# Patient Record
Sex: Male | Born: 1960 | Race: White | Hispanic: No | Marital: Married | Smoking: Never smoker
Health system: Southern US, Community
[De-identification: ages and names within clinical notes are randomized; demographics above are authoritative.]

## PROBLEM LIST (undated history)

## (undated) DIAGNOSIS — I319 Disease of pericardium, unspecified: Secondary | ICD-10-CM

## (undated) DIAGNOSIS — N200 Calculus of kidney: Secondary | ICD-10-CM

## (undated) DIAGNOSIS — E785 Hyperlipidemia, unspecified: Secondary | ICD-10-CM

## (undated) DIAGNOSIS — I2510 Atherosclerotic heart disease of native coronary artery without angina pectoris: Secondary | ICD-10-CM

## (undated) DIAGNOSIS — T7840XA Allergy, unspecified, initial encounter: Secondary | ICD-10-CM

## (undated) DIAGNOSIS — I10 Essential (primary) hypertension: Secondary | ICD-10-CM

## (undated) DIAGNOSIS — E109 Type 1 diabetes mellitus without complications: Secondary | ICD-10-CM

## (undated) HISTORY — DX: Atherosclerotic heart disease of native coronary artery without angina pectoris: I25.10

## (undated) HISTORY — DX: Type 1 diabetes mellitus without complications: E10.9

## (undated) HISTORY — PX: CARDIAC CATHETERIZATION: SHX172

## (undated) HISTORY — DX: Hyperlipidemia, unspecified: E78.5

## (undated) HISTORY — PX: LITHOTRIPSY: SUR834

## (undated) HISTORY — DX: Essential (primary) hypertension: I10

## (undated) HISTORY — DX: Calculus of kidney: N20.0

## (undated) HISTORY — DX: Allergy, unspecified, initial encounter: T78.40XA

## (undated) HISTORY — DX: Disease of pericardium, unspecified: I31.9

---

## 2001-08-23 ENCOUNTER — Encounter: Admission: RE | Admit: 2001-08-23 | Discharge: 2001-11-21 | Payer: Self-pay

## 2003-12-12 ENCOUNTER — Ambulatory Visit (HOSPITAL_COMMUNITY): Admission: RE | Admit: 2003-12-12 | Discharge: 2003-12-12 | Payer: Self-pay | Admitting: Urology

## 2004-05-13 ENCOUNTER — Emergency Department (HOSPITAL_COMMUNITY): Admission: EM | Admit: 2004-05-13 | Discharge: 2004-05-13 | Payer: Self-pay | Admitting: Emergency Medicine

## 2004-07-17 ENCOUNTER — Ambulatory Visit (HOSPITAL_COMMUNITY): Admission: RE | Admit: 2004-07-17 | Discharge: 2004-07-17 | Payer: Self-pay | Admitting: Cardiology

## 2004-07-17 DIAGNOSIS — I2510 Atherosclerotic heart disease of native coronary artery without angina pectoris: Secondary | ICD-10-CM

## 2004-07-17 HISTORY — DX: Atherosclerotic heart disease of native coronary artery without angina pectoris: I25.10

## 2006-04-20 ENCOUNTER — Ambulatory Visit (HOSPITAL_COMMUNITY): Admission: AD | Admit: 2006-04-20 | Discharge: 2006-04-20 | Payer: Self-pay | Admitting: General Surgery

## 2008-09-19 ENCOUNTER — Encounter: Admission: RE | Admit: 2008-09-19 | Discharge: 2008-10-09 | Payer: Self-pay | Admitting: Family Medicine

## 2008-10-09 ENCOUNTER — Encounter: Admission: RE | Admit: 2008-10-09 | Discharge: 2008-12-02 | Payer: Self-pay | Admitting: Family Medicine

## 2010-06-24 ENCOUNTER — Other Ambulatory Visit: Payer: Self-pay | Admitting: Internal Medicine

## 2010-06-24 DIAGNOSIS — Z8349 Family history of other endocrine, nutritional and metabolic diseases: Secondary | ICD-10-CM

## 2010-06-24 DIAGNOSIS — M542 Cervicalgia: Secondary | ICD-10-CM

## 2010-06-24 DIAGNOSIS — E1065 Type 1 diabetes mellitus with hyperglycemia: Secondary | ICD-10-CM

## 2010-06-24 DIAGNOSIS — E1049 Type 1 diabetes mellitus with other diabetic neurological complication: Secondary | ICD-10-CM

## 2010-07-01 ENCOUNTER — Ambulatory Visit
Admission: RE | Admit: 2010-07-01 | Discharge: 2010-07-01 | Disposition: A | Discharge status: Home / Self Care | Payer: BC Managed Care – PPO | Source: Ambulatory Visit | Attending: Internal Medicine | Admitting: Internal Medicine

## 2010-07-01 DIAGNOSIS — E1049 Type 1 diabetes mellitus with other diabetic neurological complication: Secondary | ICD-10-CM

## 2010-07-01 DIAGNOSIS — M542 Cervicalgia: Secondary | ICD-10-CM

## 2010-07-01 DIAGNOSIS — Z8349 Family history of other endocrine, nutritional and metabolic diseases: Secondary | ICD-10-CM

## 2010-08-28 NOTE — Consult Note (Signed)
NAMEGREGG, [name]             ACCOUNT NO.:  [id number]   MEDICAL RECORD NO.:  [id number]          PATIENT TYPE:  OUT   LOCATION:  DAY                          FACILITY:  [facility]   PHYSICIAN:  Sharlet Salina T. [name], M.D.DATE OF BIRTH:  06/10/1960   DATE OF CONSULTATION:  04/20/2006  DATE OF DISCHARGE:                                 CONSULTATION   .   CHIEF COMPLAINT:  Right lower quadrant abdominal pain.   HISTORY OF PRESENT ILLNESS:  [name] [name] is evaluated at the request  of Dr. Catha Gosselin.  He is a 50 year old male with insulin-dependent  diabetes mellitus, who awoke at about 3 a.m. or 4 a.m. this morning, now  12 hours ago, with a constant, fairly severe, right lower quadrant pain  over McBurney's point.  He states the pain was worse with any motion.  It was persistent and fairly severe, and he, therefore, presented to Dr.  Fredirick Maudlin office this morning.  He denies any nausea, vomiting, fever, or  chills.  The pain was worse with motion.  There was no radiation.  He  has no history of any similar pain.  He has had kidney stones but this  is a completely different character.   The patient was evaluated in Dr. Fredirick Maudlin office and was found to have  significant right lower quadrant tenderness and was sent for a CT scan  of the abdomen and pelvis at [facility] with results as  detailed below.  Due to concern of appendicitis,  the patient was  transferred to [facility] for my evaluation.  Interestingly,  on arrival to the hospital, he states that over the past 2-3 hours the  pain had nearly resolved.  He has only minimal, mild discomfort which he  states is probably less than a tenth of what it was earlier today.  He  is hungry.   PAST MEDICAL HISTORY:  1. Insulin-dependent diabetes mellitus.  2. Hypertension.  3. Elevated cholesterol.  4. He has a history of mild coronary disease status post coronary      catheterization.  5. History of kidney  stones.   MEDICATIONS:  1. Lantus insulin 10 at night plus sliding scale regular insulin      t.i.d. based on carbohydrate intake.  2. Metformin 500 mg b.i.d.  3. Cozaar 50 mg daily.  4. Calcium citrate.  5. Simvastatin 80 mg daily.   ALLERGIES:  LEVAQUIN.   SOCIAL HISTORY:  No cigarette use, occasional alcohol.  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   FAMILY HISTORY:  Noncontributory.   REVIEW OF SYSTEMS:  GENERAL:  No fever, chills, malaise.  RESPIRATORY:  No shortness of breath, cough, wheezing.  CARDIAC:  No chest pain,  palpitations.  ABDOMEN/GI:  As above.  GU:  No urinary burning,  frequency, or hematuria.   PHYSICAL EXAM:  Temperature is 97.7.  Vital signs all within normal  limits.  Heart rate 94.  GENERAL:  He is a thin, white male in no acute distress.  SKIN:  Warm and dry.  No rash or infection.  HEENT:  No palpable  mass or thyromegaly.  Sclerae are nonicteric.  LUNGS:  Clear without wheezing or increased work of breathing.  LYMPH NODES:  No cervical, subclavicular or inguinal nodes palpable.  CARDIAC:  Regular rate and rhythm.  No murmurs.  No edema.  ABDOMEN:  Nondistended.  Normoactive bowel sounds.  There is very mild,  right lower quadrant tenderness to deep palpation but no guarding.  No  rebound tenderness.  No palpable masses.  No organomegaly.  EXTREMITIES:  No joint swelling or deformity.  NEUROLOGIC:  Alert, oriented.  Motor and sensory exam is grossly normal.   LABORATORY:  Urinalysis at Dr. Fredirick Maudlin, positive only for glucose.  Laboratory here at [facility] includes a CBC showing a white count of  7.2 thousand, hemoglobin is 13.9.  Electrolytes all within normal  limits.  Glucose is 272.  LFTs normal.   I discussed the CT scan with Dr. Delphia Grates at [facility]  who read it and reviewed the films.  This does visualize the appendix  well which does contain contrast.  The appendix essentially appears  normal with no periappendiceal  inflammation or stranding, although there  is a very focal area of mild thickening in the mid appendix just 1-2 mm  thicker than the remainder the appendix.  This was very questionable for  early appendicitis.   ASSESSMENT AND PLAN:  A right lower quadrant pain which, now, clinically  appears to be resolving.  White count is normal and CT shows minimal, if  any, significant changes around the appendix.  I suspect he does not  have acute appendicitis or may have had a mild episode that is  spontaneously resolving.  As he clinically is so much improved, we will  plan just observation for now.  The patient is discharged home.  He  understands to call me immediately for any recurrent pain, fever,  vomiting, or other concerns.      [name] [name], M.D.  Electronically Signed     BTH/MEDQ  D:  04/20/2006  T:  04/20/2006  Job:  [id number]   cc:   Caryn Bee L. Little, M.D.  Fax: [phone]

## 2010-08-28 NOTE — Cardiovascular Report (Signed)
[name], [name]             ACCOUNT NO.:  [id number]   MEDICAL RECORD NO.:  [id number]          PATIENT TYPE:  OIB   LOCATION:  2899                         FACILITY:  [facility]   PHYSICIAN:  Armanda Magic, M.D.     DATE OF BIRTH:  [birth year]   DATE OF PROCEDURE:  07/17/2004  DATE OF DISCHARGE:                              CARDIAC CATHETERIZATION   REFERRING PHYSICIAN:  Catha Gosselin, M.D.   PROCEDURES:  Left heart catheterization, coronary angiography, left  ventriculography.   OPERATOR:  Armanda Magic, M.D.   INDICATIONS:  Chest pain, abnormal Cardiolite.   COMPLICATIONS:  None.   IV ACCESS:  Via right femoral artery 6-French sheath.   INDICATION:  This is an extremely pleasant 50 year old white male with a  history of hypertension, non-insulin-dependent diabetes mellitus and  hyperlipidemia who presented with chest pain and had a slightly abnormal  stress Cardiolite study showing a questionable small area of ischemia in the  anterior basal area as well as a fixed defect in the inferior base, but  possibly worse on stress imaging, worrisome for underlying ischemia.  The  patient now presents for heart catheterization.   DESCRIPTION OF PROCEDURE:  The patient was brought to cardiac  catheterization laboratory in a fasting non-sedated state.  Informed consent  was obtained.  The patient was connected to continuous heart rate and pulse  oximetry monitoring and intermittent blood pressure monitoring.  The right  groin was prepped and draped in a sterile fashion.  1%Xylocaine was used for  local anesthesia.  Using a modified Seldinger technique, a 6-French sheath  was placed in the right femoral artery.  Under fluoroscopic guidance, a 6-  [place] JL4 catheter was placed in the left coronary artery, but could not  engage the ostium adequately and it was exchanged out over a guidewire for 6-  [place] JL-3.5 catheter, which successfully engaged the left coronary ostium.  Multiple  cine films were taken in 30-degree RAO and 40-degree LAO views.  This catheter was then exchanged out over a guidewire for a 6-French JR4  catheter, which was placed under fluoroscopic guidance in the right coronary  artery.  Multiple cine films were taken in 30-degree RAO and 40-degree LAO  views.  This catheter was exchanged out over a guidewire for 6-French angled  pigtail catheter, which was placed under fluoroscopic guidance into the left  ventricular cavity.  Left ventriculography was performed in a 30-degree RAO  view using total of 30 mL of contrast at 15 mL per second.  The catheter was  then pulled back across the aortic valve with no significant gradient noted.  At the end of the procedure, all catheters and sheaths were removed.  Manual  compression was performed until adequate hemostasis was obtained.  The  patient was transferred back to the room in stable condition.   RESULTS:  Left main coronary is widely patent and bifurcates into the left  anterior descending artery and left circumflex artery.  The left anterior  descending artery is widely patent throughout its course to the apex, except  at the takeoff of a first diagonal, which  shows a 40% to 50% narrowing.  The  first diagonal is extremely large and is widely patent throughout its course  and bifurcates distally into 2 daughter branches.   The left circumflex system is small, giving rise to 2 obtuse marginal  branches, both of which are widely patent.   The right coronary is widely patent throughout its course and distally  bifurcates into a posterior descending artery and posterior lateral artery.   Left ventricular systolic function is normal with EF of 65%, aortic pressure  of 129/75 mmHg, LV pressure 129/1 mmHg.   ASSESSMENT:  1.  One-vessel nonobstructive coronary disease.  2.  Normal left ventricular function .  3.  Diabetes mellitus.   PLAN:  Review of films with Dr. Logan Bores, probable medical management.   He does  have an LAD stenosis, but I do not think it is flow-limiting, or does not  appear to be a significant stenosis.  We will continue on aspirin,  aggressive treatment diabetes and hyperlipidemia, no metformin for 48 hours.  He will follow with my nurse practitioner in 2 weeks for a groin check.       TT/MEDQ  D:  07/17/2004  T:  07/17/2004  Job:  [id number]   cc:   Caryn Bee L. Little, M.D.  [address]  [place]  [place] [address]  Fax: [phone]

## 2010-12-08 ENCOUNTER — Institutional Professional Consult (permissible substitution): Payer: BC Managed Care – PPO | Admitting: Critical Care Medicine

## 2010-12-10 ENCOUNTER — Encounter: Payer: Self-pay | Admitting: Critical Care Medicine

## 2010-12-10 ENCOUNTER — Other Ambulatory Visit: Payer: Self-pay | Admitting: Critical Care Medicine

## 2010-12-10 ENCOUNTER — Ambulatory Visit (INDEPENDENT_AMBULATORY_CARE_PROVIDER_SITE_OTHER): Payer: BC Managed Care – PPO | Admitting: Critical Care Medicine

## 2010-12-10 ENCOUNTER — Ambulatory Visit (HOSPITAL_BASED_OUTPATIENT_CLINIC_OR_DEPARTMENT_OTHER)
Admission: RE | Admit: 2010-12-10 | Discharge: 2010-12-10 | Disposition: A | Discharge status: Home / Self Care | Payer: BC Managed Care – PPO | Source: Ambulatory Visit | Attending: Critical Care Medicine | Admitting: Critical Care Medicine

## 2010-12-10 DIAGNOSIS — J4540 Moderate persistent asthma, uncomplicated: Secondary | ICD-10-CM | POA: Insufficient documentation

## 2010-12-10 DIAGNOSIS — R059 Cough, unspecified: Secondary | ICD-10-CM | POA: Insufficient documentation

## 2010-12-10 DIAGNOSIS — N200 Calculus of kidney: Secondary | ICD-10-CM

## 2010-12-10 DIAGNOSIS — R05 Cough: Secondary | ICD-10-CM

## 2010-12-10 DIAGNOSIS — E785 Hyperlipidemia, unspecified: Secondary | ICD-10-CM

## 2010-12-10 DIAGNOSIS — J45909 Unspecified asthma, uncomplicated: Secondary | ICD-10-CM

## 2010-12-10 DIAGNOSIS — I10 Essential (primary) hypertension: Secondary | ICD-10-CM | POA: Insufficient documentation

## 2010-12-10 DIAGNOSIS — D649 Anemia, unspecified: Secondary | ICD-10-CM

## 2010-12-10 DIAGNOSIS — E119 Type 2 diabetes mellitus without complications: Secondary | ICD-10-CM | POA: Insufficient documentation

## 2010-12-10 DIAGNOSIS — E109 Type 1 diabetes mellitus without complications: Secondary | ICD-10-CM | POA: Insufficient documentation

## 2010-12-10 MED ORDER — MOMETASONE FUROATE 220 MCG/INH IN AEPB: 2.0000 | INHALATION_SPRAY | Freq: Every day | RESPIRATORY_TRACT | Status: DC

## 2010-12-10 MED ORDER — BECLOMETHASONE DIPROPIONATE 80 MCG/ACT NA AERS: 2.0000 | INHALATION_SPRAY | Freq: Every day | NASAL | Status: DC

## 2010-12-10 NOTE — Progress Notes (Signed)
Subjective:    Patient ID: [name] [name], male    DOB: April 05, 1961, 50 y.o.   MRN: [id number]  HPI Comments: Full cardiac eval per [facility] neg  Cough This is a chronic problem. The current episode started more than 1 month ago. The problem has been waxing and waning. The problem occurs hourly. The cough is non-productive. Associated symptoms include nasal congestion, postnasal drip, shortness of breath and wheezing. Pertinent negatives include no chest pain, chills, ear pain, fever, headaches, heartburn, hemoptysis, myalgias, rash, rhinorrhea or sore throat. The symptoms are aggravated by pollens, fumes, dust, exercise and lying down (heat was a problem). He has tried a beta-agonist inhaler for the symptoms. The treatment provided no relief. His past medical history is significant for asthma, bronchitis and pneumonia. There is no history of COPD, emphysema or environmental allergies. PNA > 60yrs ago  Shortness of Breath This is a chronic problem. The current episode started more than 1 month ago. Episode frequency: was dyspneic at rest and exertion. Associated symptoms include wheezing. Pertinent negatives include no abdominal pain, chest pain, ear pain, fever, headaches, hemoptysis, leg swelling, neck pain, orthopnea, PND, rash, rhinorrhea, sore throat, sputum production or vomiting. The symptoms are aggravated by weather changes, URIs, odors, pollens, fumes, lying flat and exercise. Associated symptoms comments: Throat has to be cleared,  Notes hoarsness. He has tried beta agonist inhalers for the symptoms. The treatment provided no relief. His past medical history is significant for asthma and pneumonia. There is no history of allergies, aspirin allergies, bronchiolitis, CAD, chronic lung disease, COPD, DVT, a heart failure, PE or a recent surgery. (PNA > 71yrs ago)   50 y.o. WM Hx of bronchitis /wheeze. [name] comes on after URI, this time started May 2012, worse at night flat.  If breathe in a noise  was made. Now still dypsneic and coughing.  Now if breathe in deeply will cough  Past Medical History  Diagnosis Date  . Type I diabetes mellitus   . Asthma   . Dyslipidemia   . Hypertension   . Anemia   . Nephrolithiasis      Family History  Problem Relation Age of Onset  . Coronary artery disease Father   . Diabetes Father   . Hypertension Father   . Lung cancer Mother   . Diabetes Paternal Grandmother   . Diabetes Brother   . Hypothyroidism Sister   . Cancer Maternal Aunt   . Cancer Maternal Uncle      History   Social History  . Marital Status: Single    Spouse Name: N/A    Number of Children: N/A  . Years of Education: N/A   Occupational History  . Teacher    Social History Main Topics  . Smoking status: Never Smoker   . Smokeless tobacco: Never Used  . Alcohol Use: Yes     2 x weekly  . Drug Use: No  . Sexually Active: Not on file   Other Topics Concern  . Not on file   Social History Narrative  . No narrative on file     Allergies  Allergen Reactions  . Levaquin     stomach upset     Outpatient Prescriptions Prior to Visit  Medication Sig Dispense Refill  . albuterol (PROVENTIL HFA;VENTOLIN HFA) 108 (90 BASE) MCG/ACT inhaler Inhale 2 puffs into the lungs every 4 (four) hours as needed.        Marland Kitchen amLODipine (NORVASC) 5 MG tablet Take 5 mg by mouth daily.        Marland Kitchen  aspirin 325 MG tablet Take 325 mg by mouth daily.        Marland Kitchen glucagon (GLUCAGON EMERGENCY) 1 MG injection Inject 1 mg into the vein as directed.        . insulin aspart (NOVOLOG) 100 UNIT/ML injection Inject 45 Units into the skin daily. Via insulin pump       . rosuvastatin (CRESTOR) 20 MG tablet Take 20 mg by mouth daily.          w  Review of Systems  Constitutional: Positive for fatigue. Negative for fever, chills, diaphoresis, activity change, appetite change and unexpected weight change.  HENT: Positive for congestion, sneezing, voice change and postnasal drip. Negative for  hearing loss, ear pain, nosebleeds, sore throat, facial swelling, rhinorrhea, mouth sores, trouble swallowing, neck pain, neck stiffness, dental problem, sinus pressure, tinnitus and ear discharge.   Eyes: Negative for photophobia, discharge, itching and visual disturbance.  Respiratory: Positive for cough, shortness of breath and wheezing. Negative for apnea, hemoptysis, sputum production, choking, chest tightness and stridor.   Cardiovascular: Positive for palpitations. Negative for chest pain, orthopnea, leg swelling and PND.  Gastrointestinal: Negative for heartburn, nausea, vomiting, abdominal pain, constipation, blood in stool and abdominal distention.  Genitourinary: Negative for dysuria, urgency, frequency, hematuria, flank pain, decreased urine volume and difficulty urinating.  Musculoskeletal: Negative for myalgias, back pain, joint swelling, arthralgias and gait problem.  Skin: Negative for color change, pallor and rash.  Neurological: Negative for dizziness, tremors, seizures, syncope, speech difficulty, weakness, light-headedness, numbness and headaches.  Hematological: Negative for environmental allergies and adenopathy. Does not bruise/bleed easily.  Psychiatric/Behavioral: Negative for confusion, sleep disturbance and agitation. The patient is not nervous/anxious.        Objective:   Physical Exam  Filed Vitals:   12/10/10 0855  BP: 136/76  Pulse: 83  Temp: 98.7 F (37.1 C)  TempSrc: Oral  Height: 5\' 10"  (1.778 m)  Weight: 148 lb 8 oz (67.359 kg)  SpO2: 97%    Gen: Pleasant, well-nourished, in no distress,  normal affect  ENT: No lesions,  mouth clear,  oropharynx clear, no postnasal drip  Neck: No JVD, no TMG, no carotid bruits  Lungs: No use of accessory muscles, no dullness to percussion, clear without rales or rhonchi  Cardiovascular: RRR, heart sounds normal, no murmur or gallops, no peripheral edema  Abdomen: soft and NT, no HSM,  BS  normal  Musculoskeletal: No deformities, no cyanosis or clubbing  Neuro: alert, non focal  Skin: Warm, no lesions or rashes   8/30 CXR IMPRESSION:  Low-lying diaphragm may reflect body habitus but suggests element  of hyperinflation. No pulmonary infiltrates are present. No  pleural disease is evident.      Assessment & Plan:   Asthma with allergic rhinitis Moderate persistent asthma   Associated cyclical cough , moderate peripheral airflow obstruction on spirometry   Plan Start asmanex two puff daily Start QNasl two puff each nostril daily As needed albuterol     Updated Medication List Outpatient Encounter Prescriptions as of 12/10/2010  Medication Sig Dispense Refill  . albuterol (PROVENTIL HFA;VENTOLIN HFA) 108 (90 BASE) MCG/ACT inhaler Inhale 2 puffs into the lungs every 4 (four) hours as needed.        Marland Kitchen amLODipine (NORVASC) 5 MG tablet Take 5 mg by mouth daily.        Marland Kitchen aspirin 325 MG tablet Take 325 mg by mouth daily.        Marland Kitchen glucagon (GLUCAGON EMERGENCY) 1 MG  injection Inject 1 mg into the vein as directed.        . insulin aspart (NOVOLOG) 100 UNIT/ML injection Inject 45 Units into the skin daily. Via insulin pump       . rosuvastatin (CRESTOR) 20 MG tablet Take 20 mg by mouth daily.        . Beclomethasone Dipropionate (QNASL) 80 MCG/ACT AERS Place 2 puffs into the nose daily.  1 Inhaler  6  . mometasone (ASMANEX 120 METERED DOSES) 220 MCG/INH inhaler Inhale 2 puffs into the lungs daily.  1 Inhaler  6

## 2010-12-10 NOTE — Assessment & Plan Note (Addendum)
Moderate persistent asthma   Associated cyclical cough , moderate peripheral airflow obstruction on spirometry   Plan Start asmanex two puff daily Start QNasl two puff each nostril daily As needed albuterol

## 2010-12-10 NOTE — Patient Instructions (Signed)
Start asmanex two puff daily Start QNasl two puff each nostril daily As needed albuterol Labs and Xray today, will call results Return 6 week [place]

## 2010-12-11 LAB — ALLERGY FULL PROFILE
Allergen, D pternoyssinus,d7: <0.1 kU/L (ref <0.35)
Allergen,Goose feathers, e70: <0.1 kU/L (ref <0.35)
Alternaria Alternata: <0.1 kU/L (ref <0.35)
Aspergillus fumigatus, IgG: <0.1 kU/L (ref <0.35)
Bahia Grass: 0.26 kU/L (ref <0.35)
Bermuda Grass: <0.1 kU/L (ref <0.35)
Box Elder IgE: 0.37 kU/L — ABNORMAL HIGH (ref <0.35)
Candida Albicans: <0.1 kU/L (ref <0.35)
Cat Dander: <0.1 kU/L (ref <0.35)
Common Ragweed: 0.15 kU/L (ref <0.35)
Curvularia lunata: <0.1 kU/L (ref <0.35)
D. farinae: <0.1 kU/L (ref <0.35)
Dog Dander: <0.1 kU/L (ref <0.35)
Elm IgE: 0.14 kU/L (ref <0.35)
Fescue: <0.1 kU/L (ref <0.35)
G005 Rye, Perennial: <0.1 kU/L (ref <0.35)
G009 Red Top: <0.1 kU/L (ref <0.35)
Goldenrod: <0.1 kU/L (ref <0.35)
Helminthosporium halodes: <0.1 kU/L (ref <0.35)
House Dust Hollister: <0.1 kU/L (ref <0.35)
IgE (Immunoglobulin E), Serum: 25.4 IU/mL (ref 0.0–180.0)
Lamb's Quarters: 0.12 kU/L (ref <0.35)
Oak: 0.25 kU/L (ref <0.35)
Plantain: 0.11 kU/L (ref <0.35)
Stemphylium Botryosum: <0.1 kU/L (ref <0.35)
Sycamore Tree: 0.22 kU/L (ref <0.35)
Timothy Grass: <0.1 kU/L (ref <0.35)

## 2010-12-11 NOTE — Progress Notes (Signed)
Quick Note:  Called, spoke with pt. He is aware of cxr ok except findings of mild airway osbstruction d/t the asthma per PW. I informed him to stay on meds as prescribed at [facility] yesterday. He verbalized understanding of these results and recs. ______

## 2010-12-16 ENCOUNTER — Telehealth: Payer: Self-pay | Admitting: Critical Care Medicine

## 2010-12-16 NOTE — Telephone Encounter (Signed)
Initiated PA for Qnasl through [employer] at [phone]. Member ID # is [id number]. Qnasl is not covered. Covered alternatives are:  Nasonex, fluticasone, flunisolide and triamcinolone. Must must first try and fail 2 of the covered alternatives before Qnasl will be covered. LMOMTCB so we can notify the pt and find out if he has previously tried any of the covered alternatives before sending a new rx to his pharmacy.

## 2010-12-17 NOTE — Telephone Encounter (Signed)
Pt returned call & can be reached at [phone].  [name]

## 2010-12-18 MED ORDER — FLUTICASONE PROPIONATE 50 MCG/ACT NA SUSP: 2.0000 | Freq: Every day | NASAL | Status: DC

## 2010-12-18 NOTE — Telephone Encounter (Signed)
noted 

## 2010-12-18 NOTE — Telephone Encounter (Signed)
Pt says he has tried a sample of Nasonex in the past and it did not work well for him. He is willing to try fluticasone and will call to report if this does not work for him. [facility] [address] has been notified of this change and I will forward this msg to PW so he is aware. Medication has been updated to reflect the change.

## 2011-01-21 ENCOUNTER — Ambulatory Visit (INDEPENDENT_AMBULATORY_CARE_PROVIDER_SITE_OTHER): Payer: BC Managed Care – PPO | Admitting: Critical Care Medicine

## 2011-01-21 ENCOUNTER — Encounter: Payer: Self-pay | Admitting: Critical Care Medicine

## 2011-01-21 DIAGNOSIS — J45909 Unspecified asthma, uncomplicated: Secondary | ICD-10-CM

## 2011-01-21 NOTE — Assessment & Plan Note (Signed)
Markedly better on ICS with reactive airway disease as etiology of cyclical cough syndrome Plan Cont ICS and nasal steroid Return 4 months

## 2011-01-21 NOTE — Patient Instructions (Signed)
Continue Qnasl /fluticasone daily until second week December then as needed Continue ASmanex daily Return 4 months [place]

## 2011-01-21 NOTE — Progress Notes (Signed)
Subjective:    Patient ID: [name] [name], male    DOB: 24-Jan-1961, 50 y.o.   MRN: [id number]  HPI Comments: Full cardiac eval per Turner neg  50 y.o. WM Hx of bronchitis [name] [name]. [name] [name] comes on after URI, this time started May 2012, worse at night flat.  If breathe in a noise was made. Now still dypsneic and coughing.  Now if breathe in deeply will cough  01/21/2011 OVerall much improved with no coughing or dyspnea.   Pt denies any significant sore throat, nasal congestion or excess secretions, fever, chills, sweats, unintended weight loss, pleurtic or exertional chest pain, orthopnea PND, or leg swelling Pt denies any increase in rescue therapy over baseline, denies waking up needing it or having any early am or nocturnal exacerbations of coughing/wheezing/or dyspnea. Pt also denies any obvious fluctuation in symptoms with  weather or environmental change or other alleviating or aggravating factors   Past Medical History  Diagnosis Date  . Type I diabetes mellitus   . Asthma   . Dyslipidemia   . Hypertension   . Anemia   . Nephrolithiasis      Family History  Problem Relation Age of Onset  . Coronary artery disease Father   . Diabetes Father   . Hypertension Father   . Lung cancer Mother   . Diabetes Paternal Grandmother   . Diabetes Brother   . Hypothyroidism Sister   . Cancer Maternal Aunt   . Cancer Maternal Uncle      History   Social History  . Marital Status: Single    Spouse Name: N/A    Number of Children: N/A  . Years of Education: N/A   Occupational History  . Teacher    Social History Main Topics  . Smoking status: Never Smoker   . Smokeless tobacco: Never Used  . Alcohol Use: Yes     2 x weekly  . Drug Use: No  . Sexually Active: Not on file   Other Topics Concern  . Not on file   Social History Narrative  . No narrative on file     Allergies  Allergen Reactions  . Levaquin     stomach upset     Outpatient Prescriptions Prior to  Visit  Medication Sig Dispense Refill  . albuterol (PROVENTIL HFA;VENTOLIN HFA) 108 (90 BASE) MCG/ACT inhaler Inhale 2 puffs into the lungs every 4 (four) hours as needed.        Marland Kitchen amLODipine (NORVASC) 5 MG tablet Take 5 mg by mouth daily.        Marland Kitchen aspirin 325 MG tablet Take 325 mg by mouth daily.        . fluticasone (FLONASE) 50 MCG/ACT nasal spray Place 2 sprays into the nose daily.  16 g  2  . glucagon (GLUCAGON EMERGENCY) 1 MG injection Inject 1 mg into the vein as directed.        . insulin aspart (NOVOLOG) 100 UNIT/ML injection Inject 45 Units into the skin daily. Via insulin pump       . mometasone (ASMANEX 120 METERED DOSES) 220 MCG/INH inhaler Inhale 2 puffs into the lungs daily.  1 Inhaler  6  . rosuvastatin (CRESTOR) 20 MG tablet Take 20 mg by mouth daily.          w  Review of Systems  Constitutional: Negative for diaphoresis, activity change, appetite change, fatigue and unexpected weight change.  HENT: Negative for hearing loss, nosebleeds, congestion, facial swelling, sneezing, mouth sores, trouble  swallowing, neck stiffness, dental problem, voice change, sinus pressure, tinnitus and ear discharge.   Eyes: Negative for photophobia, discharge, itching and visual disturbance.  Respiratory: Negative for apnea, choking, chest tightness and stridor.   Cardiovascular: Negative for palpitations.  Gastrointestinal: Negative for nausea, constipation, blood in stool and abdominal distention.  Genitourinary: Negative for dysuria, urgency, frequency, hematuria, flank pain, decreased urine volume and difficulty urinating.  Musculoskeletal: Negative for back pain, joint swelling, arthralgias and gait problem.  Skin: Negative for color change and pallor.  Neurological: Negative for dizziness, tremors, seizures, syncope, speech difficulty, weakness, light-headedness and numbness.  Hematological: Negative for adenopathy. Does not bruise/bleed easily.  Psychiatric/Behavioral: Negative for  confusion, sleep disturbance and agitation. The patient is not nervous/anxious.        Objective:   Physical Exam   Filed Vitals:   01/21/11 0852  BP: 146/92  Pulse: 79  Temp: 98.1 F (36.7 C)  TempSrc: Oral  Height: 5\' 10"  (1.778 m)  Weight: 147 lb 8 oz (66.906 kg)  SpO2: 100%    Gen: Pleasant, well-nourished, in no distress,  normal affect  ENT: No lesions,  mouth clear,  oropharynx clear, no postnasal drip  Neck: No JVD, no TMG, no carotid bruits  Lungs: No use of accessory muscles, no dullness to percussion, clear without rales or rhonchi  Cardiovascular: RRR, heart sounds normal, no murmur or gallops, no peripheral edema  Abdomen: soft and NT, no HSM,  BS normal  Musculoskeletal: No deformities, no cyanosis or clubbing  Neuro: alert, non focal  Skin: Warm, no lesions or rashes        Assessment & Plan:   Asthma with allergic rhinitis Markedly better on ICS with reactive airway disease as etiology of cyclical cough syndrome Plan Cont ICS and nasal steroid Return 4 months      Updated Medication List Outpatient Encounter Prescriptions as of 01/21/2011  Medication Sig Dispense Refill  . albuterol (PROVENTIL HFA;VENTOLIN HFA) 108 (90 BASE) MCG/ACT inhaler Inhale 2 puffs into the lungs every 4 (four) hours as needed.        Marland Kitchen amLODipine (NORVASC) 5 MG tablet Take 5 mg by mouth daily.        Marland Kitchen aspirin 325 MG tablet Take 325 mg by mouth daily.        . fluticasone (FLONASE) 50 MCG/ACT nasal spray Place 2 sprays into the nose daily.  16 g  2  . glucagon (GLUCAGON EMERGENCY) 1 MG injection Inject 1 mg into the vein as directed.        . insulin aspart (NOVOLOG) 100 UNIT/ML injection Inject 45 Units into the skin daily. Via insulin pump       . mometasone (ASMANEX 120 METERED DOSES) 220 MCG/INH inhaler Inhale 2 puffs into the lungs daily.  1 Inhaler  6  . ONE TOUCH ULTRA TEST test strip as directed.      . rosuvastatin (CRESTOR) 20 MG tablet Take 20 mg by  mouth daily.

## 2011-04-13 HISTORY — PX: BACK SURGERY: SHX140

## 2011-05-25 ENCOUNTER — Encounter: Payer: Self-pay | Admitting: Critical Care Medicine

## 2011-05-25 ENCOUNTER — Ambulatory Visit (INDEPENDENT_AMBULATORY_CARE_PROVIDER_SITE_OTHER): Payer: BC Managed Care – PPO | Admitting: Critical Care Medicine

## 2011-05-25 ENCOUNTER — Ambulatory Visit (HOSPITAL_BASED_OUTPATIENT_CLINIC_OR_DEPARTMENT_OTHER)
Admission: RE | Admit: 2011-05-25 | Discharge: 2011-05-25 | Disposition: A | Discharge status: Home / Self Care | Payer: BC Managed Care – PPO | Source: Ambulatory Visit | Attending: Critical Care Medicine | Admitting: Critical Care Medicine

## 2011-05-25 VITALS — BP 132/86 | HR 95 | Temp 98.2°F | Ht 70.0 in | Wt 145.0 lb

## 2011-05-25 DIAGNOSIS — J189 Pneumonia, unspecified organism: Secondary | ICD-10-CM | POA: Insufficient documentation

## 2011-05-25 DIAGNOSIS — E119 Type 2 diabetes mellitus without complications: Secondary | ICD-10-CM | POA: Insufficient documentation

## 2011-05-25 DIAGNOSIS — R05 Cough: Secondary | ICD-10-CM

## 2011-05-25 DIAGNOSIS — R918 Other nonspecific abnormal finding of lung field: Secondary | ICD-10-CM

## 2011-05-25 DIAGNOSIS — R059 Cough, unspecified: Secondary | ICD-10-CM | POA: Insufficient documentation

## 2011-05-25 DIAGNOSIS — I10 Essential (primary) hypertension: Secondary | ICD-10-CM | POA: Insufficient documentation

## 2011-05-25 DIAGNOSIS — R062 Wheezing: Secondary | ICD-10-CM | POA: Insufficient documentation

## 2011-05-25 MED ORDER — AZITHROMYCIN 250 MG PO TABS: 250.0000 mg | ORAL_TABLET | Freq: Every day | ORAL | Status: AC

## 2011-05-25 NOTE — Progress Notes (Signed)
Subjective:    Patient ID: [name] [name], male    DOB: 19-Oct-1960, 51 y.o.   MRN: [id number]  HPI Comments: Full cardiac eval per [facility] neg  51 y.o. WM Hx of bronchitis [name] [name]. [name] [name] comes on after URI, this time started May 2012, worse at night flat.  If breathe in a noise was made. Now still dypsneic and coughing.  Now if breathe in deeply will cough  10/11 OVerall much improved with no coughing or dyspnea.   Pt denies any significant sore throat, nasal congestion or excess secretions, fever, chills, sweats, unintended weight loss, pleurtic or exertional chest pain, orthopnea PND, or leg swelling Pt denies any increase in rescue therapy over baseline, denies waking up needing it or having any early am or nocturnal exacerbations of coughing/wheezing/or dyspnea. Pt also denies any obvious fluctuation in symptoms with  weather or environmental change or other alleviating or aggravating factors  05/25/11 High fever, aches all over, started 05/14/11.  Saw PCP last 05/21/11. PNA on CXR. Rx augmentin/zpak/tessalon Now : is better ,  Still coughs .  Cough is dry.  Had flu vaccine .  Notes some wheeze.   Not that dyspneic.    No chest pain. Fever is gone. Ache and pain gone. Sinus are ok Pt denies any significant sore throat, nasal congestion or excess secretions, fever, chills, sweats, unintended weight loss, pleurtic or exertional chest pain, orthopnea PND, or leg swelling Pt denies any increase in rescue therapy over baseline, denies waking up needing it or having any early am or nocturnal exacerbations of coughing/wheezing/or dyspnea. Pt also denies any obvious fluctuation in symptoms with  weather or environmental change or other alleviating or aggravating factors    Past Medical History  Diagnosis Date  . Type I diabetes mellitus   . Asthma   . Dyslipidemia   . Hypertension   . Anemia   . Nephrolithiasis      Family History  Problem Relation Age of Onset  . Coronary artery disease  Father   . Diabetes Father   . Hypertension Father   . Lung cancer Mother   . Diabetes Paternal Grandmother   . Diabetes Brother   . Hypothyroidism Sister   . Cancer Maternal Aunt   . Cancer Maternal Uncle      History   Social History  . Marital Status: Married    Spouse Name: N/A    Number of Children: N/A  . Years of Education: N/A   Occupational History  . Teacher    Social History Main Topics  . Smoking status: Never Smoker   . Smokeless tobacco: Never Used  . Alcohol Use: Yes     2 x weekly  . Drug Use: No  . Sexually Active: Not on file   Other Topics Concern  . Not on file   Social History Narrative  . No narrative on file     Allergies  Allergen Reactions  . Levaquin     stomach upset     Outpatient Prescriptions Prior to Visit  Medication Sig Dispense Refill  . albuterol (PROVENTIL HFA;VENTOLIN HFA) 108 (90 BASE) MCG/ACT inhaler Inhale 2 puffs into the lungs every 4 (four) hours as needed.        Marland Kitchen amLODipine (NORVASC) 5 MG tablet Take 5 mg by mouth daily.        Marland Kitchen aspirin 325 MG tablet Take 325 mg by mouth daily.        Marland Kitchen glucagon (GLUCAGON EMERGENCY) 1 MG  injection Inject 1 mg into the vein as directed.        . insulin aspart (NOVOLOG) 100 UNIT/ML injection Inject 45 Units into the skin daily. Via insulin pump       . mometasone (ASMANEX 120 METERED DOSES) 220 MCG/INH inhaler Inhale 2 puffs into the lungs daily.  1 Inhaler  6  . ONE TOUCH ULTRA TEST test strip as directed.      . rosuvastatin (CRESTOR) 20 MG tablet Take 20 mg by mouth daily.        . fluticasone (FLONASE) 50 MCG/ACT nasal spray Place 2 sprays into the nose daily.  16 g  2      Review of Systems  Constitutional: Negative for diaphoresis, activity change, appetite change, fatigue and unexpected weight change.  HENT: Negative for hearing loss, nosebleeds, congestion, facial swelling, sneezing, mouth sores, trouble swallowing, neck stiffness, dental problem, voice change, sinus  pressure, tinnitus and ear discharge.   Eyes: Negative for photophobia, discharge, itching and visual disturbance.  Respiratory: Negative for apnea, choking, chest tightness and stridor.   Cardiovascular: Negative for palpitations.  Gastrointestinal: Negative for nausea, constipation, blood in stool and abdominal distention.  Genitourinary: Negative for dysuria, urgency, frequency, hematuria, flank pain, decreased urine volume and difficulty urinating.  Musculoskeletal: Negative for back pain, joint swelling, arthralgias and gait problem.  Skin: Negative for color change and pallor.  Neurological: Negative for dizziness, tremors, seizures, syncope, speech difficulty, weakness, light-headedness and numbness.  Hematological: Negative for adenopathy. Does not bruise/bleed easily.  Psychiatric/Behavioral: Negative for confusion, sleep disturbance and agitation. The patient is not nervous/anxious.        Objective:   Physical Exam   Filed Vitals:   05/25/11 0927  BP: 132/86  Pulse: 95  Temp: 98.2 F (36.8 C)  TempSrc: Oral  Height: 5\' 10"  (1.778 m)  Weight: 145 lb (65.772 kg)  SpO2: 97%    Gen: Pleasant, well-nourished, in no distress,  normal affect  ENT: No lesions,  mouth clear,  oropharynx clear, no postnasal drip  Neck: No JVD, no TMG, no carotid bruits  Lungs: No use of accessory muscles, no dullness to percussion, consolidation changes RLL  Cardiovascular: RRR, heart sounds normal, no murmur or gallops, no peripheral edema  Abdomen: soft and NT, no HSM,  BS normal  Musculoskeletal: No deformities, no cyanosis or clubbing  Neuro: alert, non focal  Skin: Warm, no lesions or rashes   Dg Chest 2 View  05/25/2011  *RADIOLOGY REPORT*  Clinical Data: Follow up pneumonia, still with coughing and wheezing, history hypertension, asthma, diabetes  CHEST - 2 VIEW  Comparison: 05/21/2011  Findings: Normal heart size, mediastinal contours, and pulmonary vascularity. Right lower  lobe infiltrate consistent with pneumonia, slightly improved. Lungs appear hyperexpanded but otherwise clear. No gross pleural effusion or pneumothorax. No acute osseous findings.  IMPRESSION: Improved right lower lobe infiltrate. Follow-up exams until resolution recommended to exclude underlying abnormalities/mass.  Original Report Authenticated By: Lollie Marrow, M.D.        Assessment & Plan:   Pneumonia Right lower lobes required pneumonia with associated asthma flare now improved no organism specified Plan extend azithromycin for additional 5 days Finish Augmentin Note  chest x-ray shows gradual clearance of right lower lobe infiltrate     Updated Medication List Outpatient Encounter Prescriptions as of 05/25/2011  Medication Sig Dispense Refill  . albuterol (PROVENTIL HFA;VENTOLIN HFA) 108 (90 BASE) MCG/ACT inhaler Inhale 2 puffs into the lungs every 4 (four) hours as needed.        Marland Kitchen  amLODipine (NORVASC) 5 MG tablet Take 5 mg by mouth daily.        Marland Kitchen amoxicillin-clavulanate (AUGMENTIN) 875-125 MG per tablet Take 1 tablet by mouth every 12 (twelve) hours.      Marland Kitchen aspirin 325 MG tablet Take 325 mg by mouth daily.        . benzonatate (TESSALON) 100 MG capsule Take 1 capsule by mouth every 8 (eight) hours as needed.      Marland Kitchen glucagon (GLUCAGON EMERGENCY) 1 MG injection Inject 1 mg into the vein as directed.        . insulin aspart (NOVOLOG) 100 UNIT/ML injection Inject 45 Units into the skin daily. Via insulin pump       . mometasone (ASMANEX 120 METERED DOSES) 220 MCG/INH inhaler Inhale 2 puffs into the lungs daily.  1 Inhaler  6  . ONE TOUCH ULTRA TEST test strip as directed.      . rosuvastatin (CRESTOR) 20 MG tablet Take 20 mg by mouth daily.        Marland Kitchen azithromycin (ZITHROMAX) 250 MG tablet Take 1 tablet (250 mg total) by mouth daily. Take two once then one daily until gone  6 each  0  . fluticasone (FLONASE) 50 MCG/ACT nasal spray Place 2 sprays into the nose as needed.      Marland Kitchen  DISCONTD: fluticasone (FLONASE) 50 MCG/ACT nasal spray Place 2 sprays into the nose daily.  16 g  2

## 2011-05-25 NOTE — Patient Instructions (Signed)
Chest xray today Take 5 days more of azithromycin 250mg   Take two once then one daily until gone Finish augmentin Stay on asmanex Return 2 weeks [place] [facility] for recheck

## 2011-05-26 NOTE — Assessment & Plan Note (Addendum)
Right lower lobes required pneumonia with associated asthma flare now improved no organism specified Plan extend azithromycin for additional 5 days Finish Augmentin Note  chest x-ray shows gradual clearance of right lower lobe infiltrate

## 2011-06-07 ENCOUNTER — Ambulatory Visit (INDEPENDENT_AMBULATORY_CARE_PROVIDER_SITE_OTHER): Payer: BC Managed Care – PPO | Admitting: Critical Care Medicine

## 2011-06-07 ENCOUNTER — Encounter: Payer: Self-pay | Admitting: Critical Care Medicine

## 2011-06-07 VITALS — BP 140/82 | HR 80 | Temp 98.0°F | Ht 70.0 in | Wt 144.5 lb

## 2011-06-07 DIAGNOSIS — J45909 Unspecified asthma, uncomplicated: Secondary | ICD-10-CM

## 2011-06-07 NOTE — Assessment & Plan Note (Addendum)
Right lower lobes required pneumonia with associated asthma flare now resolved Plan No further ABX Rx needed Cont maintenance asthma care

## 2011-06-07 NOTE — Patient Instructions (Addendum)
No change in medications. Follow a reflux diet Return in         4 months  Call sooner if cough or fever returns

## 2011-06-07 NOTE — Progress Notes (Signed)
Subjective:    Patient ID: [name] [name], male    DOB: 02/23/1961, 51 y.o.   MRN: [id number]  HPI 51 y.o. [facility]  05/25/11 High fever, aches all over, started 05/14/11.  Saw PCP last 05/21/11. PNA on CXR. Rx augmentin/zpak/tessalon Now : is better ,  Still coughs .  Cough is dry.  Had flu vaccine .  Notes some wheeze.   Not that dyspneic.    No chest pain. Fever is gone. Ache and pain gone. Sinus are ok Pt denies any significant sore throat, nasal congestion or excess secretions, fever, chills, sweats, unintended weight loss, pleurtic or exertional chest pain, orthopnea PND, or leg swelling Pt denies any increase in rescue therapy over baseline, denies waking up needing it or having any early am or nocturnal exacerbations of coughing/wheezing/or dyspnea. Pt also denies any obvious fluctuation in symptoms with  weather or environmental change or other alleviating or aggravating factors  06/07/2011 We extended ABX at last ov and pt was improving with improved RLL infiltrate No further cough, not dyspneic. No fever.  No chest pain.  CBGs are better   Past Medical History  Diagnosis Date  . Type I diabetes mellitus   . Asthma   . Dyslipidemia   . Hypertension   . Anemia   . Nephrolithiasis      Family History  Problem Relation Age of Onset  . Coronary artery disease Father   . Diabetes Father   . Hypertension Father   . Lung cancer Mother   . Diabetes Paternal Grandmother   . Diabetes Brother   . Hypothyroidism Sister   . Cancer Maternal Aunt   . Cancer Maternal Uncle      History   Social History  . Marital Status: Married    Spouse Name: N/A    Number of Children: N/A  . Years of Education: N/A   Occupational History  . Teacher    Social History Main Topics  . Smoking status: Never Smoker   . Smokeless tobacco: Never Used  . Alcohol Use: Yes     2 x weekly  . Drug Use: No  . Sexually Active: Not on file   Other Topics Concern  . Not on file   Social History  Narrative  . No narrative on file     Allergies  Allergen Reactions  . Levaquin     stomach upset     Outpatient Prescriptions Prior to Visit  Medication Sig Dispense Refill  . albuterol (PROVENTIL HFA;VENTOLIN HFA) 108 (90 BASE) MCG/ACT inhaler Inhale 2 puffs into the lungs every 4 (four) hours as needed.        Marland Kitchen amLODipine (NORVASC) 5 MG tablet Take 5 mg by mouth daily.        Marland Kitchen aspirin 325 MG tablet Take 325 mg by mouth daily.        . benzonatate (TESSALON) 100 MG capsule Take 1 capsule by mouth every 8 (eight) hours as needed.      Marland Kitchen glucagon (GLUCAGON EMERGENCY) 1 MG injection Inject 1 mg into the vein as directed.        . insulin aspart (NOVOLOG) 100 UNIT/ML injection Inject 45 Units into the skin daily. Via insulin pump       . mometasone (ASMANEX 120 METERED DOSES) 220 MCG/INH inhaler Inhale 2 puffs into the lungs daily.  1 Inhaler  6  . ONE TOUCH ULTRA TEST test strip as directed.      . rosuvastatin (CRESTOR) 20  MG tablet Take 20 mg by mouth daily.            Review of Systems  Constitutional: Negative for diaphoresis, activity change, appetite change, fatigue and unexpected weight change.  HENT: Negative for hearing loss, nosebleeds, congestion, facial swelling, sneezing, mouth sores, trouble swallowing, neck stiffness, dental problem, voice change, sinus pressure, tinnitus and ear discharge.   Eyes: Negative for photophobia, discharge, itching and visual disturbance.  Respiratory: Negative for apnea, choking, chest tightness and stridor.   Cardiovascular: Negative for palpitations.  Gastrointestinal: Negative for nausea, constipation, blood in stool and abdominal distention.  Genitourinary: Negative for dysuria, urgency, frequency, hematuria, flank pain, decreased urine volume and difficulty urinating.  Musculoskeletal: Negative for back pain, joint swelling, arthralgias and gait problem.  Skin: Negative for color change and pallor.  Neurological: Negative for  dizziness, tremors, seizures, syncope, speech difficulty, weakness, light-headedness and numbness.  Hematological: Negative for adenopathy. Does not bruise/bleed easily.  Psychiatric/Behavioral: Negative for confusion, sleep disturbance and agitation. The patient is not nervous/anxious.        Objective:   Physical Exam   Filed Vitals:   06/07/11 0857  BP: 140/82  Pulse: 80  Temp: 98 F (36.7 C)  TempSrc: Oral  Height: 5\' 10"  (1.778 m)  Weight: 144 lb 8 oz (65.545 kg)  SpO2: 98%    Gen: Pleasant, well-nourished, in no distress,  normal affect  ENT: No lesions,  mouth clear,  oropharynx clear, no postnasal drip  Neck: No JVD, no TMG, no carotid bruits  Lungs: No use of accessory muscles, no dullness to percussion, dry rales RLL, improved   Cardiovascular: RRR, heart sounds normal, no murmur or gallops, no peripheral edema  Abdomen: soft and NT, no HSM,  BS normal  Musculoskeletal: No deformities, no cyanosis or clubbing  Neuro: alert, non focal  Skin: Warm, no lesions or rashes         Assessment & Plan:   Asthma with allergic rhinitis Right lower lobes required pneumonia with associated asthma flare now resolved Plan No further ABX Rx needed Cont maintenance asthma care      Updated Medication List Outpatient Encounter Prescriptions as of 06/07/2011  Medication Sig Dispense Refill  . albuterol (PROVENTIL HFA;VENTOLIN HFA) 108 (90 BASE) MCG/ACT inhaler Inhale 2 puffs into the lungs every 4 (four) hours as needed.        Marland Kitchen amLODipine (NORVASC) 5 MG tablet Take 5 mg by mouth daily.        Marland Kitchen aspirin 325 MG tablet Take 325 mg by mouth daily.        . benzonatate (TESSALON) 100 MG capsule Take 1 capsule by mouth every 8 (eight) hours as needed.      Marland Kitchen glucagon (GLUCAGON EMERGENCY) 1 MG injection Inject 1 mg into the vein as directed.        . insulin aspart (NOVOLOG) 100 UNIT/ML injection Inject 45 Units into the skin daily. Via insulin pump       .  mometasone (ASMANEX 120 METERED DOSES) 220 MCG/INH inhaler Inhale 2 puffs into the lungs daily.  1 Inhaler  6  . ONE TOUCH ULTRA TEST test strip as directed.      . rosuvastatin (CRESTOR) 20 MG tablet Take 20 mg by mouth daily.

## 2011-09-14 ENCOUNTER — Other Ambulatory Visit: Payer: Self-pay | Admitting: Internal Medicine

## 2011-09-14 DIAGNOSIS — R221 Localized swelling, mass and lump, neck: Secondary | ICD-10-CM

## 2011-09-20 ENCOUNTER — Ambulatory Visit
Admission: RE | Admit: 2011-09-20 | Discharge: 2011-09-20 | Disposition: A | Discharge status: Home / Self Care | Payer: BC Managed Care – PPO | Source: Ambulatory Visit | Attending: Internal Medicine | Admitting: Internal Medicine

## 2011-09-20 DIAGNOSIS — R221 Localized swelling, mass and lump, neck: Secondary | ICD-10-CM

## 2011-12-09 ENCOUNTER — Ambulatory Visit (INDEPENDENT_AMBULATORY_CARE_PROVIDER_SITE_OTHER): Payer: BC Managed Care – PPO | Admitting: Critical Care Medicine

## 2011-12-09 ENCOUNTER — Encounter: Payer: Self-pay | Admitting: Critical Care Medicine

## 2011-12-09 VITALS — BP 144/80 | HR 82 | Temp 98.3°F | Ht 70.0 in | Wt 149.5 lb

## 2011-12-09 DIAGNOSIS — E109 Type 1 diabetes mellitus without complications: Secondary | ICD-10-CM

## 2011-12-09 DIAGNOSIS — J45909 Unspecified asthma, uncomplicated: Secondary | ICD-10-CM

## 2011-12-09 MED ORDER — MOMETASONE FUROATE 220 MCG/INH IN AEPB: 2.0000 | INHALATION_SPRAY | Freq: Every day | RESPIRATORY_TRACT | Status: DC

## 2011-12-09 NOTE — Patient Instructions (Addendum)
Stay on asmanex  Return 6 months Remember Flu vaccine Call us with date of your last pneumovax

## 2011-12-09 NOTE — Assessment & Plan Note (Signed)
Moderate persistent asthma with associated cyclical cough with moderate peripheral airflow obstruction and associated allergic rhinitis all of which improved Wintertime pneumonia has resolved and has not recurred Plan Stay on asmanex  Return 6 months

## 2011-12-09 NOTE — Progress Notes (Signed)
Subjective:    Patient ID: [name] [name], male    DOB: Aug 16, 1960, 51 y.o.   MRN: [id number]  HPI  51 y.o. [facility]  05/25/11 High fever, aches all over, started 05/14/11.  Saw PCP last 05/21/11. PNA on CXR. Rx augmentin/zpak/tessalon Now : is better ,  Still coughs .  Cough is dry.  Had flu vaccine .  Notes some wheeze.   Not that dyspneic.    No chest pain. Fever is gone. Ache and pain gone. Sinus are ok Pt denies any significant sore throat, nasal congestion or excess secretions, fever, chills, sweats, unintended weight loss, pleurtic or exertional chest pain, orthopnea PND, or leg swelling Pt denies any increase in rescue therapy over baseline, denies waking up needing it or having any early am or nocturnal exacerbations of coughing/wheezing/or dyspnea. Pt also denies any obvious fluctuation in symptoms with  weather or environmental change or other alleviating or aggravating factors  06/07/2011 We extended ABX at last ov and pt was improving with improved RLL infiltrate No further cough, not dyspneic. No fever.  No chest pain.  CBGs are better   12/09/2011 No new issues since the winter. Pt denies any significant sore throat, nasal congestion or excess secretions, fever, chills, sweats, unintended weight loss, pleurtic or exertional chest pain, orthopnea PND, or leg swelling Pt denies any increase in rescue therapy over baseline, denies waking up needing it or having any early am or nocturnal exacerbations of coughing/wheezing/or dyspnea. Pt also denies any obvious fluctuation in symptoms with  weather or environmental change or other alleviating or aggravating factors  PUL ASTHMA HISTORY 12/09/2011  Symptoms 0-2 days/week  Nighttime awakenings 0-2/month  Interference with activity No limitations  SABA use 0-2 days/wk  Exacerbations requiring oral steroids 0-1 / year     Past Medical History  Diagnosis Date  . Type I diabetes mellitus   . Asthma   . Dyslipidemia   . Hypertension     . Anemia   . Nephrolithiasis      Family History  Problem Relation Age of Onset  . Coronary artery disease Father   . Diabetes Father   . Hypertension Father   . Lung cancer Mother   . Diabetes Paternal Grandmother   . Diabetes Brother   . Hypothyroidism Sister   . Cancer Maternal Aunt   . Cancer Maternal Uncle      History   Social History  . Marital Status: Married    Spouse Name: N/A    Number of Children: N/A  . Years of Education: N/A   Occupational History  . Teacher    Social History Main Topics  . Smoking status: Never Smoker   . Smokeless tobacco: Never Used  . Alcohol Use: Yes     2 x weekly  . Drug Use: No  . Sexually Active: Not on file   Other Topics Concern  . Not on file   Social History Narrative  . No narrative on file     Allergies  Allergen Reactions  . Levofloxacin     stomach upset     Outpatient Prescriptions Prior to Visit  Medication Sig Dispense Refill  . albuterol (PROVENTIL HFA;VENTOLIN HFA) 108 (90 BASE) MCG/ACT inhaler Inhale 2 puffs into the lungs every 4 (four) hours as needed.        Marland Kitchen amLODipine (NORVASC) 5 MG tablet Take 5 mg by mouth daily.        Marland Kitchen aspirin 325 MG tablet Take 325 mg  by mouth daily.        . benzonatate (TESSALON) 100 MG capsule Take 1 capsule by mouth every 8 (eight) hours as needed.      Marland Kitchen glucagon (GLUCAGON EMERGENCY) 1 MG injection Inject 1 mg into the vein as directed.        . insulin aspart (NOVOLOG) 100 UNIT/ML injection Inject 45 Units into the skin daily. Via insulin pump       . ONE TOUCH ULTRA TEST test strip as directed.      . rosuvastatin (CRESTOR) 20 MG tablet Take 20 mg by mouth daily.        . mometasone (ASMANEX 120 METERED DOSES) 220 MCG/INH inhaler Inhale 2 puffs into the lungs daily.  1 Inhaler  6      Review of Systems  Constitutional: Negative for diaphoresis, activity change, appetite change, fatigue and unexpected weight change.  HENT: Negative for hearing loss, nosebleeds,  congestion, facial swelling, sneezing, mouth sores, trouble swallowing, neck stiffness, dental problem, voice change, sinus pressure, tinnitus and ear discharge.   Eyes: Negative for photophobia, discharge, itching and visual disturbance.  Respiratory: Negative for apnea, choking, chest tightness and stridor.   Cardiovascular: Negative for palpitations.  Gastrointestinal: Negative for nausea, constipation, blood in stool and abdominal distention.  Genitourinary: Negative for dysuria, urgency, frequency, hematuria, flank pain, decreased urine volume and difficulty urinating.  Musculoskeletal: Negative for back pain, joint swelling, arthralgias and gait problem.  Skin: Negative for color change and pallor.  Neurological: Negative for dizziness, tremors, seizures, syncope, speech difficulty, weakness, light-headedness and numbness.  Hematological: Negative for adenopathy. Does not bruise/bleed easily.  Psychiatric/Behavioral: Negative for confusion, disturbed wake/sleep cycle and agitation. The patient is not nervous/anxious.        Objective:   Physical Exam   Filed Vitals:   12/09/11 1000  BP: 144/80  Pulse: 82  Temp: 98.3 F (36.8 C)  TempSrc: Oral  Height: 5\' 10"  (1.778 m)  Weight: 149 lb 8 oz (67.813 kg)  SpO2: 98%    Gen: Pleasant, well-nourished, in no distress,  normal affect  ENT: No lesions,  mouth clear,  oropharynx clear, no postnasal drip  Neck: No JVD, no TMG, no carotid bruits  Lungs: No use of accessory muscles, no dullness to percussion, dry rales RLL, improved   Cardiovascular: RRR, heart sounds normal, no murmur or gallops, no peripheral edema  Abdomen: soft and NT, no HSM,  BS normal  Musculoskeletal: No deformities, no cyanosis or clubbing  Neuro: alert, non focal  Skin: Warm, no lesions or rashes         Assessment & Plan:   Asthma with allergic rhinitis Moderate persistent asthma with associated cyclical cough with moderate peripheral  airflow obstruction and associated allergic rhinitis all of which improved Wintertime pneumonia has resolved and has not recurred Plan Stay on asmanex  Return 6 months     Updated Medication List Outpatient Encounter Prescriptions as of 12/09/2011  Medication Sig Dispense Refill  . albuterol (PROVENTIL HFA;VENTOLIN HFA) 108 (90 BASE) MCG/ACT inhaler Inhale 2 puffs into the lungs every 4 (four) hours as needed.        Marland Kitchen amLODipine (NORVASC) 5 MG tablet Take 5 mg by mouth daily.        Marland Kitchen aspirin 325 MG tablet Take 325 mg by mouth daily.        . benzonatate (TESSALON) 100 MG capsule Take 1 capsule by mouth every 8 (eight) hours as needed.      Marland Kitchen  glucagon (GLUCAGON EMERGENCY) 1 MG injection Inject 1 mg into the vein as directed.        . insulin aspart (NOVOLOG) 100 UNIT/ML injection Inject 45 Units into the skin daily. Via insulin pump       . mometasone (ASMANEX 120 METERED DOSES) 220 MCG/INH inhaler Inhale 2 puffs into the lungs daily.  1 Inhaler  11  . ONE TOUCH ULTRA TEST test strip as directed.      . rosuvastatin (CRESTOR) 20 MG tablet Take 20 mg by mouth daily.        Marland Kitchen DISCONTD: mometasone (ASMANEX 120 METERED DOSES) 220 MCG/INH inhaler Inhale 2 puffs into the lungs daily.  1 Inhaler  6

## 2011-12-16 ENCOUNTER — Telehealth: Payer: Self-pay | Admitting: Critical Care Medicine

## 2011-12-16 NOTE — Telephone Encounter (Signed)
Noted  

## 2011-12-16 NOTE — Telephone Encounter (Signed)
I have added this into pt's chart in the immunizations tab. Will also forward to Dr. Delford Field so he is aware.

## 2012-05-15 ENCOUNTER — Telehealth: Payer: Self-pay | Admitting: Critical Care Medicine

## 2012-05-15 NOTE — Telephone Encounter (Signed)
Called pt x 3 to make next appt per recall.  Left 3 messages and pt never called back.  Mailed recall letter 05/15/12. [name] [name]  °

## 2012-08-24 ENCOUNTER — Encounter: Payer: Self-pay | Admitting: Critical Care Medicine

## 2012-08-24 ENCOUNTER — Ambulatory Visit (INDEPENDENT_AMBULATORY_CARE_PROVIDER_SITE_OTHER): Payer: BC Managed Care – PPO | Admitting: Critical Care Medicine

## 2012-08-24 ENCOUNTER — Ambulatory Visit (HOSPITAL_BASED_OUTPATIENT_CLINIC_OR_DEPARTMENT_OTHER)
Admission: RE | Admit: 2012-08-24 | Discharge: 2012-08-24 | Disposition: A | Discharge status: Home / Self Care | Payer: BC Managed Care – PPO | Source: Ambulatory Visit | Attending: Critical Care Medicine | Admitting: Critical Care Medicine

## 2012-08-24 VITALS — BP 122/82 | HR 97 | Temp 98.2°F | Ht 70.0 in | Wt 141.5 lb

## 2012-08-24 DIAGNOSIS — J40 Bronchitis, not specified as acute or chronic: Secondary | ICD-10-CM | POA: Insufficient documentation

## 2012-08-24 DIAGNOSIS — I10 Essential (primary) hypertension: Secondary | ICD-10-CM | POA: Insufficient documentation

## 2012-08-24 DIAGNOSIS — J45909 Unspecified asthma, uncomplicated: Secondary | ICD-10-CM

## 2012-08-24 DIAGNOSIS — J189 Pneumonia, unspecified organism: Secondary | ICD-10-CM

## 2012-08-24 DIAGNOSIS — R059 Cough, unspecified: Secondary | ICD-10-CM | POA: Insufficient documentation

## 2012-08-24 DIAGNOSIS — J209 Acute bronchitis, unspecified: Secondary | ICD-10-CM

## 2012-08-24 DIAGNOSIS — R05 Cough: Secondary | ICD-10-CM | POA: Insufficient documentation

## 2012-08-24 DIAGNOSIS — Z8701 Personal history of pneumonia (recurrent): Secondary | ICD-10-CM | POA: Insufficient documentation

## 2012-08-24 MED ORDER — AZITHROMYCIN 250 MG PO TABS: 250.0000 mg | ORAL_TABLET | Freq: Every day | ORAL | Status: DC

## 2012-08-24 MED ORDER — MOMETASONE FUROATE 220 MCG/INH IN AEPB: INHALATION_SPRAY | RESPIRATORY_TRACT | Status: DC

## 2012-08-24 MED ORDER — CEFUROXIME AXETIL 500 MG PO TABS: 500.0000 mg | ORAL_TABLET | Freq: Two times a day (BID) | ORAL | Status: DC

## 2012-08-24 MED ORDER — LOSARTAN POTASSIUM 50 MG PO TABS: 50.0000 mg | ORAL_TABLET | Freq: Every day | ORAL | Status: DC

## 2012-08-24 NOTE — Patient Instructions (Addendum)
Azithromycin 250mg  Take two once then one daily until gone Ceftin 500mg  bid x 10days Stop lisinopril Start Losartan 50mg  daily Stop asmanex for 7days then resume Use albuterol only as needed, reduce use rate A chest xray will be obtained 6 week return visit Use peak flow meter to check flow rates twice daily until return

## 2012-08-24 NOTE — Progress Notes (Signed)
Subjective:    Patient ID: [name] [name], male    DOB: Dec 21, 1960, 52 y.o.   MRN: [id number]  HPI  52 y.o. [facility]   08[facility]/2012 [facility] Rx tessalon,albuterol, cough syrup. No abx. 10days of illness.  Pt worse with more fever, chills, temp elevation.  Cough now is non prod.  No pndrip. No sinus press,  Not dyspneic.  Throat was sore.  Notes some wheezing.  Temp 100.2 4pm 08/23/12   Past Medical History  Diagnosis Date  . Type I diabetes mellitus   . Asthma   . Dyslipidemia   . Hypertension   . Anemia   . Nephrolithiasis      Family History  Problem Relation Age of Onset  . Coronary artery disease Father   . Diabetes Father   . Hypertension Father   . Lung cancer Mother   . Diabetes Paternal Grandmother   . Diabetes Brother   . Hypothyroidism Sister   . Cancer Maternal Aunt   . Cancer Maternal Uncle      History   Social History  . Marital Status: Married    Spouse Name: N/A    Number of Children: N/A  . Years of Education: N/A   Occupational History  . Teacher    Social History Main Topics  . Smoking status: Never Smoker   . Smokeless tobacco: Never Used  . Alcohol Use: Yes     Comment: 2 x weekly  . Drug Use: No  . Sexually Active: Not on file   Other Topics Concern  . Not on file   Social History Narrative  . No narrative on file     Allergies  Allergen Reactions  . Levofloxacin     stomach upset     Outpatient Prescriptions Prior to Visit  Medication Sig Dispense Refill  . albuterol (PROVENTIL HFA;VENTOLIN HFA) 108 (90 BASE) MCG/ACT inhaler Inhale 2 puffs into the lungs every 4 (four) hours as needed.        Marland Kitchen amLODipine (NORVASC) 5 MG tablet Take 5 mg by mouth daily.        Marland Kitchen aspirin 325 MG tablet Take 325 mg by mouth daily.        . benzonatate (TESSALON) 100 MG capsule Take 1 capsule by mouth every 8 (eight) hours as needed.      Marland Kitchen glucagon (GLUCAGON EMERGENCY) 1 MG injection Inject 1 mg into the vein  as directed.        . insulin aspart (NOVOLOG) 100 UNIT/ML injection Inject 45 Units into the skin daily. Via insulin pump       . ONE TOUCH ULTRA TEST test strip as directed.      . rosuvastatin (CRESTOR) 20 MG tablet Take 20 mg by mouth daily.        . mometasone (ASMANEX 120 METERED DOSES) 220 MCG/INH inhaler Inhale 2 puffs into the lungs daily.  1 Inhaler  11   No facility-administered medications prior to visit.      Review of Systems  Constitutional: Positive for fever and fatigue. Negative for diaphoresis, activity change, appetite change and unexpected weight change.  HENT: Negative for hearing loss, nosebleeds, congestion, facial swelling, sneezing, mouth sores, trouble swallowing, neck stiffness, dental problem, voice change, sinus pressure, tinnitus and ear discharge.   Eyes: Negative for photophobia, discharge, itching and visual disturbance.  Respiratory: Positive for cough, shortness of breath and wheezing. Negative for apnea, choking, chest tightness and stridor.  Cardiovascular: Negative for palpitations.  Gastrointestinal: Negative for nausea, constipation, blood in stool and abdominal distention.  Genitourinary: Negative for dysuria, urgency, frequency, hematuria, flank pain, decreased urine volume and difficulty urinating.  Musculoskeletal: Negative for back pain, joint swelling, arthralgias and gait problem.  Skin: Negative for color change and pallor.  Neurological: Negative for dizziness, tremors, seizures, syncope, speech difficulty, weakness, light-headedness and numbness.  Hematological: Negative for adenopathy. Does not bruise/bleed easily.  Psychiatric/Behavioral: Negative for confusion, sleep disturbance and agitation. The patient is not nervous/anxious.        Objective:   Physical Exam   Filed Vitals:   08/24/12 1127  BP: 122/82  Pulse: 97  Temp: 98.2 F (36.8 C)  TempSrc: Oral  Height: 5\' 10"  (1.778 m)  Weight: 141 lb 8 oz (64.184 kg)  SpO2: 97%     Gen: Pleasant, well-nourished, in no distress,  normal affect  ENT: No lesions,  mouth clear,  oropharynx clear, no postnasal drip  Neck: No JVD, no TMG, no carotid bruits  Lungs: No use of accessory muscles, no dullness to percussion, expired wheezes at the bases no rhonchi Cardiovascular: RRR, heart sounds normal, no murmur or gallops, no peripheral edema  Abdomen: soft and NT, no HSM,  BS normal  Musculoskeletal: No deformities, no cyanosis or clubbing  Neuro: alert, non focal  Skin: Warm, no lesions or rashes   Dg Chest 2 View  08/24/2012   *RADIOLOGY REPORT*  Clinical Data: Cough, bronchitis  CHEST - 2 VIEW  Comparison: 05/25/2011  Findings: Cardiomediastinal silhouette is stable.  Hyperinflation again noted.  There is streaky airspace disease right base with blunting of the right costophrenic angle.  Although findings may be due to recurrent pneumonia neoplastic process cannot be excluded. Further evaluation with CT scan of the chest is recommended.  IMPRESSION: Hyperinflation again noted.  There is streaky airspace disease right base with blunting of the right costophrenic angle.  Although findings may be due to recurrent pneumonia neoplastic process cannot be excluded.  Further evaluation with CT scan of the chest is recommended.   Original Report Authenticated By: Natasha Mead, M.D.         Assessment & Plan:   Asthma with allergic rhinitis Moderate persistent asthma now with atypical PNA RLL and asthma flare  Overuse of beta agonists leading to tachycardia Upper airway instability and cough aggravated by ACE inhibitor use and dry powdered inhaler Plan Azithromycin 250mg  Take two once then one daily until gone Ceftin 500mg  bid x 10days Stop lisinopril Start Losartan 50mg  daily Stop asmanex for 7days then resume Use albuterol only as needed, reduce use rate Monitor peak flow rate    Updated Medication List Outpatient Encounter Prescriptions as of 08/24/2012   Medication Sig Dispense Refill  . albuterol (PROVENTIL HFA;VENTOLIN HFA) 108 (90 BASE) MCG/ACT inhaler Inhale 2 puffs into the lungs every 4 (four) hours as needed.        Marland Kitchen amLODipine (NORVASC) 5 MG tablet Take 5 mg by mouth daily.        Marland Kitchen aspirin 325 MG tablet Take 325 mg by mouth daily.        . benzonatate (TESSALON) 100 MG capsule Take 1 capsule by mouth every 8 (eight) hours as needed.      Marland Kitchen glucagon (GLUCAGON EMERGENCY) 1 MG injection Inject 1 mg into the vein as directed.        . insulin aspart (NOVOLOG) 100 UNIT/ML injection Inject 45 Units into the skin daily. Via insulin pump       .  IOPHEN C-NR 100-10 MG/5ML syrup Take by mouth. 2 teaspoons every 4 hours as needed      . mometasone (ASMANEX 120 METERED DOSES) 220 MCG/INH inhaler HOLD for 7days then resume  1 Inhaler  11  . ONE TOUCH ULTRA TEST test strip as directed.      . rosuvastatin (CRESTOR) 20 MG tablet Take 20 mg by mouth daily.        . [DISCONTINUED] lisinopril (PRINIVIL,ZESTRIL) 5 MG tablet Take 1 tablet by mouth daily.      . [DISCONTINUED] mometasone (ASMANEX 120 METERED DOSES) 220 MCG/INH inhaler Inhale 2 puffs into the lungs daily.  1 Inhaler  11  . azithromycin (ZITHROMAX) 250 MG tablet Take 1 tablet (250 mg total) by mouth daily. Take two once then one daily until gone  6 each  0  . cefUROXime (CEFTIN) 500 MG tablet Take 1 tablet (500 mg total) by mouth 2 (two) times daily.  20 tablet  0  . losartan (COZAAR) 50 MG tablet Take 1 tablet (50 mg total) by mouth daily.  30 tablet  6   No facility-administered encounter medications on file as of 08/24/2012.

## 2012-08-24 NOTE — Assessment & Plan Note (Addendum)
Moderate persistent asthma now with atypical PNA RLL and asthma flare  Overuse of beta agonists leading to tachycardia Upper airway instability and cough aggravated by ACE inhibitor use and dry powdered inhaler Plan Azithromycin 250mg  Take two once then one daily until gone Ceftin 500mg  bid x 10days Stop lisinopril Start Losartan 50mg  daily Stop asmanex for 7days then resume Use albuterol only as needed, reduce use rate Monitor peak flow rate

## 2012-09-11 ENCOUNTER — Ambulatory Visit (INDEPENDENT_AMBULATORY_CARE_PROVIDER_SITE_OTHER)
Admission: RE | Admit: 2012-09-11 | Discharge: 2012-09-11 | Disposition: A | Discharge status: Home / Self Care | Payer: BC Managed Care – PPO | Source: Ambulatory Visit | Attending: Critical Care Medicine | Admitting: Critical Care Medicine

## 2012-09-11 ENCOUNTER — Encounter: Payer: Self-pay | Admitting: Critical Care Medicine

## 2012-09-11 ENCOUNTER — Ambulatory Visit (INDEPENDENT_AMBULATORY_CARE_PROVIDER_SITE_OTHER): Payer: BC Managed Care – PPO | Admitting: Critical Care Medicine

## 2012-09-11 ENCOUNTER — Telehealth: Payer: Self-pay | Admitting: Critical Care Medicine

## 2012-09-11 VITALS — BP 122/72 | HR 91 | Temp 97.3°F | Ht 70.0 in | Wt 143.0 lb

## 2012-09-11 DIAGNOSIS — J4541 Moderate persistent asthma with (acute) exacerbation: Secondary | ICD-10-CM

## 2012-09-11 DIAGNOSIS — R059 Cough, unspecified: Secondary | ICD-10-CM

## 2012-09-11 DIAGNOSIS — J45901 Unspecified asthma with (acute) exacerbation: Secondary | ICD-10-CM

## 2012-09-11 DIAGNOSIS — R05 Cough: Secondary | ICD-10-CM

## 2012-09-11 DIAGNOSIS — J189 Pneumonia, unspecified organism: Secondary | ICD-10-CM

## 2012-09-11 MED ORDER — METHYLPREDNISOLONE ACETATE 80 MG/ML IJ SUSP
120.0000 mg | Freq: Once | INTRAMUSCULAR | Status: AC
  Administered 2012-09-11: 120 mg via INTRAMUSCULAR

## 2012-09-11 MED ORDER — DEXTROMETHORPHAN POLISTIREX 30 MG/5ML PO LQCR: ORAL | Status: DC

## 2012-09-11 MED ORDER — BENZONATATE 100 MG PO CAPS: ORAL_CAPSULE | ORAL | Status: DC

## 2012-09-11 MED ORDER — MOMETASONE FUROATE 220 MCG/INH IN AEPB: 2.0000 | INHALATION_SPRAY | Freq: Every day | RESPIRATORY_TRACT | Status: DC

## 2012-09-11 NOTE — Telephone Encounter (Signed)
Needs CXR and sooner OV with any provider

## 2012-09-11 NOTE — Progress Notes (Signed)
Subjective:    Patient ID: [name] [name], male    DOB: 03/08/1961, 52 y.o.   MRN: [id number]  HPI  52 y.o. [facility]   08[facility]/2012 [facility] Rx tessalon,albuterol, cough syrup. No abx. 10days of illness.  Pt worse with more fever, chills, temp elevation.  Cough now is non prod.  No pndrip. No sinus press,  Not dyspneic.  Throat was sore.  Notes some wheezing.  Temp 100.2 4pm 08/23/12  09/11/2012 Fever gone, CXR now normal.  Still coughing, no mucus, notes some chest tightness, still dyspneic, notes wheezing. No pndrip.  Awakens from sleep dyspneic.  Past Medical History  Diagnosis Date  . Type I diabetes mellitus   . Asthma   . Dyslipidemia   . Hypertension   . Anemia   . Nephrolithiasis      Family History  Problem Relation Age of Onset  . Coronary artery disease Father   . Diabetes Father   . Hypertension Father   . Lung cancer Mother   . Diabetes Paternal Grandmother   . Diabetes Brother   . Hypothyroidism Sister   . Cancer Maternal Aunt   . Cancer Maternal Uncle      History   Social History  . Marital Status: Married    Spouse Name: N/A    Number of Children: N/A  . Years of Education: N/A   Occupational History  . Teacher    Social History Main Topics  . Smoking status: Never Smoker   . Smokeless tobacco: Never Used  . Alcohol Use: Yes     Comment: 2 x weekly  . Drug Use: No  . Sexually Active: Not on file   Other Topics Concern  . Not on file   Social History Narrative  . No narrative on file     Allergies  Allergen Reactions  . Levofloxacin     stomach upset     Outpatient Prescriptions Prior to Visit  Medication Sig Dispense Refill  . albuterol (PROVENTIL HFA;VENTOLIN HFA) 108 (90 BASE) MCG/ACT inhaler Inhale 2 puffs into the lungs every 4 (four) hours as needed.        Marland Kitchen amLODipine (NORVASC) 5 MG tablet Take 5 mg by mouth daily.        Marland Kitchen aspirin 325 MG tablet Take 325 mg by mouth daily.        Marland Kitchen  glucagon (GLUCAGON EMERGENCY) 1 MG injection Inject 1 mg into the vein as directed.        . insulin aspart (NOVOLOG) 100 UNIT/ML injection Inject 45 Units into the skin daily. Via insulin pump       . IOPHEN C-NR 100-10 MG/5ML syrup Take by mouth. 2 teaspoons every 4 hours as needed      . losartan (COZAAR) 50 MG tablet Take 1 tablet (50 mg total) by mouth daily.  30 tablet  6  . ONE TOUCH ULTRA TEST test strip as directed.      . rosuvastatin (CRESTOR) 20 MG tablet Take 20 mg by mouth daily.        . benzonatate (TESSALON) 100 MG capsule Take 1 capsule by mouth every 8 (eight) hours as needed.      . cefUROXime (CEFTIN) 500 MG tablet Take 1 tablet (500 mg total) by mouth 2 (two) times daily.  20 tablet  0  . mometasone (ASMANEX 120 METERED DOSES) 220 MCG/INH inhaler HOLD for 7days then resume  1 Inhaler  11  .  azithromycin (ZITHROMAX) 250 MG tablet Take 1 tablet (250 mg total) by mouth daily. Take two once then one daily until gone  6 each  0   No facility-administered medications prior to visit.      Review of Systems  Constitutional: Positive for fever and fatigue. Negative for diaphoresis, activity change, appetite change and unexpected weight change.  HENT: Negative for hearing loss, nosebleeds, congestion, facial swelling, sneezing, mouth sores, trouble swallowing, neck stiffness, dental problem, voice change, sinus pressure, tinnitus and ear discharge.   Eyes: Negative for photophobia, discharge, itching and visual disturbance.  Respiratory: Positive for cough, shortness of breath and wheezing. Negative for apnea, choking, chest tightness and stridor.   Cardiovascular: Negative for palpitations.  Gastrointestinal: Negative for nausea, constipation, blood in stool and abdominal distention.  Genitourinary: Negative for dysuria, urgency, frequency, hematuria, flank pain, decreased urine volume and difficulty urinating.  Musculoskeletal: Negative for back pain, joint swelling, arthralgias  and gait problem.  Skin: Negative for color change and pallor.  Neurological: Negative for dizziness, tremors, seizures, syncope, speech difficulty, weakness, light-headedness and numbness.  Hematological: Negative for adenopathy. Does not bruise/bleed easily.  Psychiatric/Behavioral: Negative for confusion, sleep disturbance and agitation. The patient is not nervous/anxious.        Objective:   Physical Exam   Filed Vitals:   09/11/12 1338  BP: 122/72  Pulse: 91  Temp: 97.3 F (36.3 C)  TempSrc: Oral  Height: 5\' 10"  (1.778 m)  Weight: 143 lb (64.864 kg)  SpO2: 97%    Gen: Pleasant, well-nourished, in no distress,  normal affect  ENT: No lesions,  mouth clear,  oropharynx clear, no postnasal drip  Neck: No JVD, no TMG, no carotid bruits  Lungs: No use of accessory muscles, no dullness to percussion, expired wheezes at the bases no rhonchi Cardiovascular: RRR, heart sounds normal, no murmur or gallops, no peripheral edema  Abdomen: soft and NT, no HSM,  BS normal  Musculoskeletal: No deformities, no cyanosis or clubbing  Neuro: alert, non focal  Skin: Warm, no lesions or rashes   Dg Chest 2 View  09/11/2012   *RADIOLOGY REPORT*  Clinical Data: Cough and shortness of breath.  CHEST - 2 VIEW  Comparison: PA and lateral chest 08/24/2012 and 05/25/2011.  Findings: There is some unchanged right basilar scarring.  Lungs are otherwise clear.  Heart size is normal.  No pneumothorax or pleural fluid.  IMPRESSION: No acute disease.   Original Report Authenticated By: Holley Dexter, M.D.         Assessment & Plan:   Asthma with allergic rhinitis History of moderate persistent asthma with associated cyclical cough and recent right lower lobe community acquired pneumonia now resolved Plan No additional antibiotics indicated We'll use cough protocol with Delsym and benzonatate to suppress cough Administer Depo-Medrol 120 mg IM times one dose Hold Asmanex for 2 weeks then  resume as the dry powdered inhaler may be aggravating the cough    Updated Medication List Outpatient Encounter Prescriptions as of 09/11/2012  Medication Sig Dispense Refill  . albuterol (PROVENTIL HFA;VENTOLIN HFA) 108 (90 BASE) MCG/ACT inhaler Inhale 2 puffs into the lungs every 4 (four) hours as needed.        Marland Kitchen amLODipine (NORVASC) 5 MG tablet Take 5 mg by mouth daily.        Marland Kitchen aspirin 325 MG tablet Take 325 mg by mouth daily.        . benzonatate (TESSALON) 100 MG capsule Use 1-2 every 4 hours per  cough protocol  90 capsule  4  . glucagon (GLUCAGON EMERGENCY) 1 MG injection Inject 1 mg into the vein as directed.        . insulin aspart (NOVOLOG) 100 UNIT/ML injection Inject 45 Units into the skin daily. Via insulin pump       . IOPHEN C-NR 100-10 MG/5ML syrup Take by mouth. 2 teaspoons every 4 hours as needed      . losartan (COZAAR) 50 MG tablet Take 1 tablet (50 mg total) by mouth daily.  30 tablet  6  . mometasone (ASMANEX) 220 MCG/INH inhaler Inhale 2 puffs into the lungs daily.  1 Inhaler    . ONE TOUCH ULTRA TEST test strip as directed.      . rosuvastatin (CRESTOR) 20 MG tablet Take 20 mg by mouth daily.        . [DISCONTINUED] benzonatate (TESSALON) 100 MG capsule Take 1 capsule by mouth every 8 (eight) hours as needed.      . [DISCONTINUED] cefUROXime (CEFTIN) 500 MG tablet Take 1 tablet (500 mg total) by mouth 2 (two) times daily.  20 tablet  0  . [DISCONTINUED] mometasone (ASMANEX 120 METERED DOSES) 220 MCG/INH inhaler HOLD for 7days then resume  1 Inhaler  11  . [DISCONTINUED] mometasone (ASMANEX) 220 MCG/INH inhaler Inhale 2 puffs into the lungs daily.      Marland Kitchen dextromethorphan (DELSYM) 30 MG/5ML liquid Use 3-4 times daily per cough protocol  90 mL  0  . [DISCONTINUED] azithromycin (ZITHROMAX) 250 MG tablet Take 1 tablet (250 mg total) by mouth daily. Take two once then one daily until gone  6 each  0  . [EXPIRED] methylPREDNISolone acetate (DEPO-MEDROL) injection 120 mg         No facility-administered encounter medications on file as of 09/11/2012.

## 2012-09-11 NOTE — Telephone Encounter (Signed)
I spoke with pt. He stated he finished his ABX on 09/03/12. He is still having coughing fits (occasionally with get up phlem but unsure color), wheezing, chest tx and still gets SOB. He states sometimes it is worse at night. He had to use his rescue inhaler this AM. He has pending appt 10/05/12. He is wanting to know if this is normal recovery time since he had PNA or if needs to be seen sooner. Please advise Dr. Delford Field thanks  Allergies  Allergen Reactions  . Levofloxacin     stomach upset

## 2012-09-11 NOTE — Assessment & Plan Note (Signed)
History of moderate persistent asthma with associated cyclical cough and recent right lower lobe community acquired pneumonia now resolved Plan No additional antibiotics indicated We'll use cough protocol with Delsym and benzonatate to suppress cough Administer Depo-Medrol 120 mg IM times one dose Hold Asmanex for 2 weeks then resume as the dry powdered inhaler may be aggravating the cough

## 2012-09-11 NOTE — Patient Instructions (Signed)
Hold asmanex for two weeks then resume USe Delsym and benzonatate per cough protocol A depomedrol injection 120mg  was given No further antibiotics needed Return 2 weeks for recheck with Tammy Parrett

## 2012-09-11 NOTE — Telephone Encounter (Signed)
I spoke with pt and is aware of recs. He is going to come in and be seen by OW today in the [place] office. He is aware of the address. CXR order has been placed.

## 2012-10-05 ENCOUNTER — Ambulatory Visit (INDEPENDENT_AMBULATORY_CARE_PROVIDER_SITE_OTHER): Payer: BC Managed Care – PPO | Admitting: Critical Care Medicine

## 2012-10-05 ENCOUNTER — Encounter: Payer: Self-pay | Admitting: Critical Care Medicine

## 2012-10-05 VITALS — BP 132/84 | HR 72 | Temp 98.1°F | Ht 70.0 in | Wt 144.0 lb

## 2012-10-05 DIAGNOSIS — J45909 Unspecified asthma, uncomplicated: Secondary | ICD-10-CM

## 2012-10-05 DIAGNOSIS — J4540 Moderate persistent asthma, uncomplicated: Secondary | ICD-10-CM

## 2012-10-05 DIAGNOSIS — J189 Pneumonia, unspecified organism: Secondary | ICD-10-CM

## 2012-10-05 MED ORDER — BENZONATATE 100 MG PO CAPS: ORAL_CAPSULE | ORAL | Status: DC

## 2012-10-05 NOTE — Patient Instructions (Signed)
Labs today Immuneglobulin assay, will call results Stay on asmanex 2 puff daily Check peak flow rates as needed and about twice weekly on schedule, call if 20% drop from 500, ie persistent PFR of 400 or less Change benzonatate to as needed Return 3 months

## 2012-10-05 NOTE — Progress Notes (Signed)
Subjective:    Patient ID: [name] [name], male    DOB: March 10, 1961, 52 y.o.   MRN: [id number]  HPI  52 y.o. [facility]  10/05/2012 Chief Complaint  Patient presents with  . 2 wk follow up    Breathing has improved.  Does have an occas dry cough but overall this has improved as well.  No SOB, wheezing, chest tightness, or chest pain at this time.   Not as much cough. Is better. Pt will get irritated if laugh or blow nose.  Pt is using benzonatate on schedule Now back on asmanex 2puff daily Pt denies any significant sore throat, nasal congestion or excess secretions, fever, chills, sweats, unintended weight loss, pleurtic or exertional chest pain, orthopnea PND, or leg swelling Pt denies any increase in rescue therapy over baseline, denies waking up needing it or having any early am or nocturnal exacerbations of coughing/wheezing/or dyspnea. Pt also denies any obvious fluctuation in symptoms with  weather or environmental change or other alleviating or aggravating factors  PUL ASTHMA HISTORY 10/05/2012 12/09/2011  Symptoms 0-2 days/week 0-2 days/week  Nighttime awakenings 0-2/month 0-2/month  Interference with activity No limitations No limitations  SABA use 0-2 days/wk 0-2 days/wk  Exacerbations requiring oral steroids 0-1 / year 0-1 / year       Past Medical History  Diagnosis Date  . Type I diabetes mellitus   . Asthma   . Dyslipidemia   . Hypertension   . Anemia   . Nephrolithiasis      Family History  Problem Relation Age of Onset  . Coronary artery disease Father   . Diabetes Father   . Hypertension Father   . Lung cancer Mother   . Diabetes Paternal Grandmother   . Diabetes Brother   . Hypothyroidism Sister   . Cancer Maternal Aunt   . Cancer Maternal Uncle      History   Social History  . Marital Status: Married    Spouse Name: N/A    Number of Children: N/A  . Years of Education: N/A   Occupational History  . Teacher    Social History Main Topics  .  Smoking status: Never Smoker   . Smokeless tobacco: Never Used  . Alcohol Use: Yes     Comment: 2 x weekly  . Drug Use: No  . Sexually Active: Not on file   Other Topics Concern  . Not on file   Social History Narrative  . No narrative on file     Allergies  Allergen Reactions  . Levofloxacin     stomach upset     Outpatient Prescriptions Prior to Visit  Medication Sig Dispense Refill  . albuterol (PROVENTIL HFA;VENTOLIN HFA) 108 (90 BASE) MCG/ACT inhaler Inhale 2 puffs into the lungs every 4 (four) hours as needed.        Marland Kitchen amLODipine (NORVASC) 5 MG tablet Take 5 mg by mouth daily.        Marland Kitchen aspirin 325 MG tablet Take 325 mg by mouth daily.        Marland Kitchen glucagon (GLUCAGON EMERGENCY) 1 MG injection Inject 1 mg into the vein as directed.        . insulin aspart (NOVOLOG) 100 UNIT/ML injection Inject 45 Units into the skin daily. Via insulin pump       . losartan (COZAAR) 50 MG tablet Take 1 tablet (50 mg total) by mouth daily.  30 tablet  6  . mometasone (ASMANEX) 220 MCG/INH inhaler Inhale 2 puffs  into the lungs daily.  1 Inhaler    . ONE TOUCH ULTRA TEST test strip as directed.      . rosuvastatin (CRESTOR) 20 MG tablet Take 20 mg by mouth daily.        . benzonatate (TESSALON) 100 MG capsule Use 1-2 every 4 hours per cough protocol  90 capsule  4  . dextromethorphan (DELSYM) 30 MG/5ML liquid Use 3-4 times daily per cough protocol  90 mL  0  . IOPHEN C-NR 100-10 MG/5ML syrup Take by mouth. 2 teaspoons every 4 hours as needed       No facility-administered medications prior to visit.      Review of Systems  Constitutional: Positive for fever and fatigue. Negative for diaphoresis, activity change, appetite change and unexpected weight change.  HENT: Negative for hearing loss, nosebleeds, congestion, facial swelling, sneezing, mouth sores, trouble swallowing, neck stiffness, dental problem, voice change, sinus pressure, tinnitus and ear discharge.   Eyes: Negative for  photophobia, discharge, itching and visual disturbance.  Respiratory: Positive for cough, shortness of breath and wheezing. Negative for apnea, choking, chest tightness and stridor.   Cardiovascular: Negative for palpitations.  Gastrointestinal: Negative for nausea, constipation, blood in stool and abdominal distention.  Genitourinary: Negative for dysuria, urgency, frequency, hematuria, flank pain, decreased urine volume and difficulty urinating.  Musculoskeletal: Negative for back pain, joint swelling, arthralgias and gait problem.  Skin: Negative for color change and pallor.  Neurological: Negative for dizziness, tremors, seizures, syncope, speech difficulty, weakness, light-headedness and numbness.  Hematological: Negative for adenopathy. Does not bruise/bleed easily.  Psychiatric/Behavioral: Negative for confusion, sleep disturbance and agitation. The patient is not nervous/anxious.        Objective:   Physical Exam   Filed Vitals:   10/05/12 0923  BP: 132/84  Pulse: 72  Temp: 98.1 F (36.7 C)  TempSrc: Oral  Height: 5\' 10"  (1.778 m)  Weight: 144 lb (65.318 kg)  SpO2: 98%    Gen: Pleasant, well-nourished, in no distress,  normal affect  ENT: No lesions,  mouth clear,  oropharynx clear, no postnasal drip  Neck: No JVD, no TMG, no carotid bruits  Lungs: No use of accessory muscles, no dullness to percussion, clear, no wheezes  Cardiovascular: RRR, heart sounds normal, no murmur or gallops, no peripheral edema  Abdomen: soft and NT, no HSM,  BS normal  Musculoskeletal: No deformities, no cyanosis or clubbing  Neuro: alert, non focal  Skin: Warm, no lesions or rashes  PFR 550-600  No results found.       Assessment & Plan:   Moderate persistent asthma with allergic rhinitis Moderate persistent asthma improved with recent CAP/pneumonia resolved  Plan Cont asmanex Prn benzonatate No further ABX Follow PFR . Best ever 500. Call if 20% fall or greater= 400 or  less rov 3 months chck IgG,A,M levels ?immunodeficency  Note DM will drive immune system issues    Updated Medication List Outpatient Encounter Prescriptions as of 10/05/2012  Medication Sig Dispense Refill  . albuterol (PROVENTIL HFA;VENTOLIN HFA) 108 (90 BASE) MCG/ACT inhaler Inhale 2 puffs into the lungs every 4 (four) hours as needed.        Marland Kitchen amLODipine (NORVASC) 5 MG tablet Take 5 mg by mouth daily.        Marland Kitchen aspirin 325 MG tablet Take 325 mg by mouth daily.        . benzonatate (TESSALON) 100 MG capsule Use 1-2 every 4- 6  Hours as needed for cough  90 capsule  4  . glucagon (GLUCAGON EMERGENCY) 1 MG injection Inject 1 mg into the vein as directed.        . insulin aspart (NOVOLOG) 100 UNIT/ML injection Inject 45 Units into the skin daily. Via insulin pump       . losartan (COZAAR) 50 MG tablet Take 1 tablet (50 mg total) by mouth daily.  30 tablet  6  . mometasone (ASMANEX) 220 MCG/INH inhaler Inhale 2 puffs into the lungs daily.  1 Inhaler    . ONE TOUCH ULTRA TEST test strip as directed.      . rosuvastatin (CRESTOR) 20 MG tablet Take 20 mg by mouth daily.        . [DISCONTINUED] benzonatate (TESSALON) 100 MG capsule Use 1-2 every 4 hours per cough protocol  90 capsule  4  . [DISCONTINUED] dextromethorphan (DELSYM) 30 MG/5ML liquid Use 3-4 times daily per cough protocol  90 mL  0  . [DISCONTINUED] IOPHEN C-NR 100-10 MG/5ML syrup Take by mouth. 2 teaspoons every 4 hours as needed       No facility-administered encounter medications on file as of 10/05/2012.

## 2012-10-05 NOTE — Assessment & Plan Note (Signed)
Moderate persistent asthma improved with recent CAP/pneumonia resolved  Plan Cont asmanex Prn benzonatate No further ABX Follow PFR . Best ever 500. Call if 20% fall or greater= 400 or less rov 3 months chck IgG,A,M levels ?immunodeficency  Note DM will drive immune system issues

## 2012-10-06 LAB — IGG, IGA, IGM
IgA: 112 mg/dL (ref 68–379)
IgG (Immunoglobin G), Serum: 781 mg/dL (ref 650–1600)
IgM, Serum: 130 mg/dL (ref 41–251)

## 2012-10-06 NOTE — Progress Notes (Signed)
Quick Note:  Call pt and tell him immuneglobulin levels are normal. No immune deficiency , No change in medications ______

## 2012-10-06 NOTE — Progress Notes (Signed)
Quick Note:  Called, spoke with pt. Informed him of lab results and recs per Dr. Wright. He verbalized understanding and voiced no further questions or concerns at this time. ______ 

## 2012-12-18 ENCOUNTER — Telehealth: Payer: Self-pay | Admitting: Critical Care Medicine

## 2012-12-18 NOTE — Telephone Encounter (Signed)
left messages to call and schd follow up apt. No return call back. Sent letter 12/18/12 °

## 2013-01-09 ENCOUNTER — Other Ambulatory Visit: Payer: Self-pay | Admitting: Cardiology

## 2013-01-09 DIAGNOSIS — E7800 Pure hypercholesterolemia, unspecified: Secondary | ICD-10-CM

## 2013-01-09 DIAGNOSIS — Z79899 Other long term (current) drug therapy: Secondary | ICD-10-CM

## 2013-02-15 ENCOUNTER — Other Ambulatory Visit: Payer: Self-pay

## 2013-02-28 ENCOUNTER — Other Ambulatory Visit: Payer: Self-pay | Admitting: Critical Care Medicine

## 2013-03-05 ENCOUNTER — Telehealth: Payer: Self-pay | Admitting: Cardiology

## 2013-03-05 ENCOUNTER — Encounter: Payer: Self-pay | Admitting: Cardiovascular Disease

## 2013-03-05 ENCOUNTER — Ambulatory Visit (INDEPENDENT_AMBULATORY_CARE_PROVIDER_SITE_OTHER): Payer: BC Managed Care – PPO | Admitting: Nurse Practitioner

## 2013-03-05 ENCOUNTER — Other Ambulatory Visit: Payer: Self-pay | Admitting: *Deleted

## 2013-03-05 ENCOUNTER — Other Ambulatory Visit: Payer: Self-pay | Admitting: Nurse Practitioner

## 2013-03-05 VITALS — BP 145/78 | HR 96 | Resp 16 | Wt 138.5 lb

## 2013-03-05 DIAGNOSIS — I2510 Atherosclerotic heart disease of native coronary artery without angina pectoris: Secondary | ICD-10-CM

## 2013-03-05 DIAGNOSIS — R079 Chest pain, unspecified: Secondary | ICD-10-CM

## 2013-03-05 LAB — TROPONIN I: Troponin I: <0.3 ng/mL (ref <0.30)

## 2013-03-05 MED ORDER — NITROGLYCERIN 0.4 MG SL SUBL: 0.4000 mg | SUBLINGUAL_TABLET | SUBLINGUAL | Status: DC | PRN

## 2013-03-05 MED ORDER — CARVEDILOL 6.25 MG PO TABS: 6.2500 mg | ORAL_TABLET | Freq: Two times a day (BID) | ORAL | Status: DC

## 2013-03-05 NOTE — Telephone Encounter (Signed)
Received call directly from [facility]; patient c/o chest discomfort since Saturday night. Patient states "it feels like a giant gas bubble that is expanding."  Patient c/o some nausea and upset stomach, states pain is not constant and is worse when is laying down; denies SOB, diaphoresis.  Patient states he did not eat anything unusual on Saturday prior to the onset of this discomfort.  Patient states he had a heart cath in 2006 which revealed 40% blockage of one of the vessels.  I advised patient of the option of going directly to the ER or coming into the office for a nurse visit/EKG and appropriate treatment to follow.  I advised him that Dr. Mayford Knife is not in the office today.  Patient prefers to come to the office for nurse visit.  Patient is en route to the office.

## 2013-03-05 NOTE — Telephone Encounter (Signed)
New message  Patient called in with chest pain and tightness. Sent call to triage, Marcelino Duster spoke with patient.

## 2013-03-05 NOTE — Telephone Encounter (Signed)
Called patient to report normal troponin.  Patient verbalized understanding and states he is resting now.  I advised patient to call if he has problems prior to appointment Wednesday.  Patient verbalized agreement and understanding

## 2013-03-05 NOTE — Progress Notes (Signed)
Patient presents ambulatory from lobby for nurse visit; patient in no acute distress, ambulates without difficulty, skin warm, dry and acyanotic.  Patient taken to exam room.  Patient c/o chest discomfort onset Saturday night; states pain awakened him from his sleep at approximately 0200.  Patient states he had eaten a large dinner of chinese food and states he thought maybe the food was causing him problems.  Patient states he got up and took 2 antacids and attempted to go back to sleep; states he had no relief.  Patient states he has experienced intermittent pain since that time which he describes as "a big gas bubble that shrinks and expands."  Patient denies SOB, diaphoresis; denies radiation of pain.  Patient states he has been a little nauseated and had 2 loose stools.  A 12-lead ekg was preformed and taken to Dr. Elease Hashimoto for review with most recent previous ekg.  Dr. Elease Hashimoto recommends stat troponin level, and addition of Coreg 6.25 mg BID and NTG tabs to patient's therapy and f/u with Dr. Mayford Knife this week.  I spoke with Launa Flight, CMA who advised patient come in [place]. 11/26 @ 10:15.  I discussed plan with patient who verbalized agreement.  Patient taken to the lab in no acute distress and advised that he may leave after that.  Patient's Rx sent to pharmacy and patient aware of appointment date/time.

## 2013-03-07 ENCOUNTER — Ambulatory Visit (INDEPENDENT_AMBULATORY_CARE_PROVIDER_SITE_OTHER): Payer: BC Managed Care – PPO | Admitting: Cardiology

## 2013-03-07 ENCOUNTER — Encounter: Payer: Self-pay | Admitting: Cardiology

## 2013-03-07 ENCOUNTER — Encounter: Payer: Self-pay | Admitting: General Surgery

## 2013-03-07 VITALS — BP 156/80 | HR 91 | Ht 70.0 in | Wt 139.0 lb

## 2013-03-07 DIAGNOSIS — I10 Essential (primary) hypertension: Secondary | ICD-10-CM

## 2013-03-07 DIAGNOSIS — T50995A Adverse effect of other drugs, medicaments and biological substances, initial encounter: Secondary | ICD-10-CM

## 2013-03-07 DIAGNOSIS — E785 Hyperlipidemia, unspecified: Secondary | ICD-10-CM

## 2013-03-07 DIAGNOSIS — I2510 Atherosclerotic heart disease of native coronary artery without angina pectoris: Secondary | ICD-10-CM | POA: Insufficient documentation

## 2013-03-07 DIAGNOSIS — T7840XA Allergy, unspecified, initial encounter: Secondary | ICD-10-CM | POA: Insufficient documentation

## 2013-03-07 DIAGNOSIS — R079 Chest pain, unspecified: Secondary | ICD-10-CM

## 2013-03-07 HISTORY — DX: Allergy, unspecified, initial encounter: T78.40XA

## 2013-03-07 MED ORDER — DIPHENHYDRAMINE HCL 25 MG PO CAPS: 25.0000 mg | ORAL_CAPSULE | Freq: Four times a day (QID) | ORAL | Status: DC | PRN

## 2013-03-07 MED ORDER — DILTIAZEM HCL ER COATED BEADS 240 MG PO CP24: 240.0000 mg | ORAL_CAPSULE | Freq: Every day | ORAL | Status: DC

## 2013-03-07 MED ORDER — METHYLPREDNISOLONE 4 MG PO KIT: PACK | ORAL | Status: DC

## 2013-03-07 NOTE — Patient Instructions (Signed)
Your physician has recommended you make the following change in your medication: Stop Amlodipine and Coreg Start Medrol Dose pack and take as directed, Start Benadryl 25 MG OTC every 6 hours as needed for itching, and start Cardizem CD 240 MG once tablet daily.  Your physician has requested that you have an echocardiogram. Echocardiography is a painless test that uses sound waves to create images of your heart. It provides your doctor with information about the size and shape of your heart and how well your heart's chambers and valves are working. This procedure takes approximately one hour. There are no restrictions for this procedure.  Your physician has requested that you have en exercise stress myoview. For further information please visit [email or web address] Please follow instruction sheet, as given.  Your physician recommends that you schedule a follow-up appointment in: one week with Dr Mayford Knife

## 2013-03-07 NOTE — Progress Notes (Signed)
[address] [place], [place]  [address] Phone: [phone] Fax:  [phone]  Date:  03/07/2013   ID:  MCKALE HAFFEY, DOB 10-10-1960, MRN [id number]  PCP:  Mickie Hillier, MD  Cardiologist:  Armanda Magic, MD     History of Present Illness: [name] [name] is a 52 y.o. male with a history of nonobstructive ASCAD, HTN and dyslipidemia who presents today for followup.  He says that last weekend he has some pain and pressure in his chest that would come in waves and be off and on.  It felt like an expanding gas bubble that would come and go.  He took 2 peptobismol tabs that did not help.  He came in on Monday for EKG which was normal and started him on Carvedilol due to tachycardia and palpitations.  He has not taken any NTG.  On Monday he felt much better with resolution of his CP.  He took the Coreg Monday with his lunch and then Tuesday am and then got a rash and his hands swelled up.  He has not used any Benadryl.  He denies any SOB, DOE, LE edema, dizziness or syncope.   Wt Readings from Last 3 Encounters:  03/07/13 139 lb (63.05 kg)  03/05/13 138 lb 8 oz (62.823 kg)  10/05/12 144 lb (65.318 kg)     Past Medical History  Diagnosis Date  . Type I diabetes mellitus   . Asthma   . Dyslipidemia   . Anemia   . Nephrolithiasis   . Coronary artery disease 07/17/2004    nonobstructive ASCAD with 40-50% LAD  . Hyperlipidemia   . Hypertension     Current Outpatient Prescriptions  Medication Sig Dispense Refill  . albuterol (PROVENTIL HFA;VENTOLIN HFA) 108 (90 BASE) MCG/ACT inhaler Inhale 2 puffs into the lungs every 4 (four) hours as needed.        Marland Kitchen amLODipine (NORVASC) 5 MG tablet Take 5 mg by mouth daily.        Marland Kitchen aspirin 325 MG tablet Take 325 mg by mouth daily.        . benzonatate (TESSALON) 100 MG capsule Use 1-2 every 4- 6  Hours as needed for cough  90 capsule  4  . carvedilol (COREG) 6.25 MG tablet Take 1 tablet (6.25 mg total) by mouth 2 (two) times  daily.  180 tablet  3  . glucagon (GLUCAGON EMERGENCY) 1 MG injection Inject 1 mg into the vein as directed.        . insulin aspart (NOVOLOG) 100 UNIT/ML injection Inject 45 Units into the skin daily. Via insulin pump       . losartan (COZAAR) 50 MG tablet Take 1 tablet (50 mg total) by mouth daily.  30 tablet  6  . mometasone (ASMANEX) 220 MCG/INH inhaler Inhale 2 puffs into the lungs daily.  1 Inhaler    . nitroGLYCERIN (NITROSTAT) 0.4 MG SL tablet Place 1 tablet (0.4 mg total) under the tongue every 5 (five) minutes as needed for chest pain.  90 tablet  3  . ONE TOUCH ULTRA TEST test strip as directed.      . rosuvastatin (CRESTOR) 20 MG tablet Take 20 mg by mouth daily.         No current facility-administered medications for this visit.    Allergies:    Allergies  Allergen Reactions  . Levofloxacin     stomach upset    Social History:  The patient  reports that  he has never smoked. He has never used smokeless tobacco. He reports that he drinks alcohol. He reports that he does not use illicit drugs.   Family History:  The patient's family history includes Cancer in his maternal aunt and maternal uncle; Coronary artery disease in his father; Diabetes in his brother, father, and paternal grandmother; Hypertension in his father; Hypothyroidism in his sister; Lung cancer in his mother.   ROS:  Please see the history of present illness.      All other systems reviewed and negative.   PHYSICAL EXAM: VS:  BP 156/80  Pulse 91  Ht 5\' 10"  (1.778 m)  Wt 139 lb (63.05 kg)  BMI 19.94 kg/m2 Well nourished, well developed, in no acute distress HEENT: normal Neck: no JVD Cardiac:  normal S1, S2; RRR; no murmur Lungs:  clear to auscultation bilaterally, no wheezing, rhonchi or rales Abd: soft, nontender, no hepatomegaly Ext: bilateral hand swelling with maculopapular rash Skin: warm and dry Neuro:  CNs 2-12 intact, no focal abnormalities noted  EKG:  NSR with nonspecific ST abnormality      ASSESSMENT AND PLAN:  1. Atypical CP with normal EKG.  No further CP since the weekend.  ? Coronary ischemia vs.   - Stress myoview to rule out ischemia given 50% LAD stenosis on cath in 2006  - 2D echo to eval LVF, rule out pericardial effusion 2. Dyslipidemia  - continue Crestor 3. HTN - mildly elevated BP  - change to Cardizem CD 240mg  daily which should help with tachycardia as well   - continue Losartan 4. Acute allergic reaction to Coreg  - will give a Medrol dose pack  - Benadryl 25mg  q6 hours for swelling and rash   - stop Coreg 5.  ASCAD nonobstructive   - continue ASA  Followup with me in 1 week  Signed, Armanda Magic, MD 03/07/2013 10:35 AM

## 2013-03-13 ENCOUNTER — Telehealth: Payer: Self-pay | Admitting: Cardiology

## 2013-03-13 ENCOUNTER — Ambulatory Visit (INDEPENDENT_AMBULATORY_CARE_PROVIDER_SITE_OTHER): Payer: BC Managed Care – PPO | Admitting: Critical Care Medicine

## 2013-03-13 ENCOUNTER — Encounter: Payer: Self-pay | Admitting: Critical Care Medicine

## 2013-03-13 VITALS — BP 146/82 | HR 74 | Ht 70.0 in | Wt 142.0 lb

## 2013-03-13 DIAGNOSIS — J4540 Moderate persistent asthma, uncomplicated: Secondary | ICD-10-CM

## 2013-03-13 DIAGNOSIS — I10 Essential (primary) hypertension: Secondary | ICD-10-CM

## 2013-03-13 DIAGNOSIS — J45909 Unspecified asthma, uncomplicated: Secondary | ICD-10-CM

## 2013-03-13 MED ORDER — AMLODIPINE BESYLATE 10 MG PO TABS: 10.0000 mg | ORAL_TABLET | Freq: Every day | ORAL | Status: DC

## 2013-03-13 NOTE — Telephone Encounter (Signed)
Spoke with pt and gave him instructions from Dr. Mayford Knife. He reports he has not taken Cardizem today and is feeling a little better. He will contact primary care MD for further evaluation of symptoms.  Will send prescription for Amlodipine to [facility] on [address].

## 2013-03-13 NOTE — Patient Instructions (Signed)
No changes in medications  Return 6 months

## 2013-03-13 NOTE — Assessment & Plan Note (Signed)
Moderate persistent asthma stable at this time Plan Maintain inhaled medications as prescribed 

## 2013-03-13 NOTE — Telephone Encounter (Signed)
Spoke with patient who c/o facial/neck flushing, a slight feeling of constriction and chest discomfort, and elevated blood sugar since starting Cardizem on Thursday 11/27.  Patient states the swelling from the Coreg resolved and patient finished taking his steroid dose pack; states the symptoms that he believes are coming from the Cardizem are different from the reaction to the Coreg.  Patient states he had no side effects with the Amlodipine and that when he saw Dr. Mayford Knife on 11/26 that she mentioned possibly increasing that dose but decided to have the patient start the Cardizem instead.  Patient would like to stop taking the Cardizem and try higher dose of Amlodipine.  I advised patient that Dr. Mayford Knife is working in the hospital today and that I will send message to her for advice.  Patient verbalized agreement and understanding.

## 2013-03-13 NOTE — Progress Notes (Signed)
Subjective:    Patient ID: [name] [name], male    DOB: [birth year], 52 y.o.   MRN: [id number]  HPI 52 y.o. [facility]    03/13/2013 Chief Complaint  Patient presents with  . Asthma    Breathing has been doing well. Reports that he has not had any problems since his last OV. Denies SOB, coughing or issues with his sinuses.  No issues. Overall the patient is doing well has not required rescue inhaler use or recent steroid use. There is no cough congestion or sinus symptoms Pt denies any significant sore throat, nasal congestion or excess secretions, fever, chills, sweats, unintended weight loss, pleurtic or exertional chest pain, orthopnea PND, or leg swelling Pt denies any increase in rescue therapy over baseline, denies waking up needing it or having any early am or nocturnal exacerbations of coughing/wheezing/or dyspnea. Pt also denies any obvious fluctuation in symptoms with  weather or environmental change or other alleviating or aggravating factors   PUL ASTHMA HISTORY 03/13/2013 10/05/2012 12/09/2011  Symptoms 0-2 days/week 0-2 days/week 0-2 days/week  Nighttime awakenings 0-2/month 0-2/month 0-2/month  Interference with activity No limitations No limitations No limitations  SABA use 0-2 days/wk 0-2 days/wk 0-2 days/wk  Exacerbations requiring oral steroids 0-1 / year 0-1 / year 0-1 / year       Past Medical History  Diagnosis Date  . Type I diabetes mellitus   . Asthma   . Dyslipidemia   . Anemia   . Nephrolithiasis   . Coronary artery disease 07/17/2004    nonobstructive ASCAD with 40-50% LAD  . Hyperlipidemia   . Hypertension      Family History  Problem Relation Age of Onset  . Coronary artery disease Father   . Diabetes Father   . Hypertension Father   . Lung cancer Mother   . Diabetes Paternal Grandmother   . Diabetes Brother   . Hypothyroidism Sister   . Cancer Maternal Aunt   . Cancer Maternal Uncle      History   Social History  . Marital Status:  Married    Spouse Name: N/A    Number of Children: N/A  . Years of Education: N/A   Occupational History  . Teacher    Social History Main Topics  . Smoking status: Never Smoker   . Smokeless tobacco: Never Used  . Alcohol Use: Yes     Comment: 2 x weekly  . Drug Use: No  . Sexual Activity: Not on file   Other Topics Concern  . Not on file   Social History Narrative  . No narrative on file     Allergies  Allergen Reactions  . Carvedilol Hives    Hives and Itching  . Levofloxacin     stomach upset     Outpatient Prescriptions Prior to Visit  Medication Sig Dispense Refill  . albuterol (PROVENTIL HFA;VENTOLIN HFA) 108 (90 BASE) MCG/ACT inhaler Inhale 2 puffs into the lungs every 4 (four) hours as needed.        Marland Kitchen aspirin 325 MG tablet Take 325 mg by mouth daily.        . benzonatate (TESSALON) 100 MG capsule Use 1-2 every 4- 6  Hours as needed for cough  90 capsule  4  . diltiazem (CARDIZEM CD) 240 MG 24 hr capsule Take 1 capsule (240 mg total) by mouth daily.  30 capsule  11  . diphenhydrAMINE (BENADRYL) 25 mg capsule Take 1 capsule (25 mg total) by mouth every 6 (  six) hours as needed for itching.  30 capsule  0  . glucagon (GLUCAGON EMERGENCY) 1 MG injection Inject 1 mg into the vein as directed.        . insulin aspart (NOVOLOG) 100 UNIT/ML injection Inject 45 Units into the skin daily. Via insulin pump       . losartan (COZAAR) 50 MG tablet Take 1 tablet (50 mg total) by mouth daily.  30 tablet  6  . mometasone (ASMANEX) 220 MCG/INH inhaler Inhale 2 puffs into the lungs daily.  1 Inhaler    . nitroGLYCERIN (NITROSTAT) 0.4 MG SL tablet Place 1 tablet (0.4 mg total) under the tongue every 5 (five) minutes as needed for chest pain.  90 tablet  3  . ONE TOUCH ULTRA TEST test strip as directed.      . rosuvastatin (CRESTOR) 20 MG tablet Take 20 mg by mouth daily.        . methylPREDNISolone (MEDROL DOSEPAK) 4 MG tablet follow package directions  21 tablet  0   No  facility-administered medications prior to visit.      Review of Systems  Constitutional: Positive for fatigue. Negative for diaphoresis, activity change and unexpected weight change.  HENT: Negative for congestion, dental problem, ear discharge, facial swelling, hearing loss, mouth sores, nosebleeds, sinus pressure, tinnitus and voice change.   Eyes: Negative for photophobia, discharge, itching and visual disturbance.  Respiratory: Negative for apnea, choking, chest tightness and stridor.   Cardiovascular: Negative for palpitations.  Gastrointestinal: Negative for nausea, constipation, blood in stool and abdominal distention.  Genitourinary: Negative for dysuria, urgency, frequency, hematuria, flank pain, decreased urine volume and difficulty urinating.  Musculoskeletal: Negative for arthralgias, back pain, gait problem, joint swelling and neck stiffness.  Skin: Negative for color change and pallor.  Neurological: Negative for dizziness, tremors, seizures, syncope, speech difficulty, weakness, light-headedness and numbness.  Hematological: Negative for adenopathy. Does not bruise/bleed easily.  Psychiatric/Behavioral: Negative for confusion, sleep disturbance and agitation. The patient is not nervous/anxious.        Objective:   Physical Exam   Filed Vitals:   03/13/13 0925  BP: 146/82  Pulse: 74  Height: 5\' 10"  (1.778 m)  Weight: 142 lb (64.411 kg)  SpO2: 98%    Gen: Pleasant, well-nourished, in no distress,  normal affect  ENT: No lesions,  mouth clear,  oropharynx clear, no postnasal drip  Neck: No JVD, no TMG, no carotid bruits  Lungs: No use of accessory muscles, no dullness to percussion, clear, no wheezes  Cardiovascular: RRR, heart sounds normal, no murmur or gallops, no peripheral edema  Abdomen: soft and NT, no HSM,  BS normal  Musculoskeletal: No deformities, no cyanosis or clubbing  Neuro: alert, non focal  Skin: Warm, no lesions or rashes   No  results found.       Assessment & Plan:   Moderate persistent asthma with allergic rhinitis Moderate persistent asthma stable at this time Plan Maintain inhaled medications as prescribed    Updated Medication List Outpatient Encounter Prescriptions as of 03/13/2013  Medication Sig  . albuterol (PROVENTIL HFA;VENTOLIN HFA) 108 (90 BASE) MCG/ACT inhaler Inhale 2 puffs into the lungs every 4 (four) hours as needed.    Marland Kitchen aspirin 325 MG tablet Take 325 mg by mouth daily.    . benzonatate (TESSALON) 100 MG capsule Use 1-2 every 4- 6  Hours as needed for cough  . diltiazem (CARDIZEM CD) 240 MG 24 hr capsule Take 1 capsule (240 mg total) by  mouth daily.  . diphenhydrAMINE (BENADRYL) 25 mg capsule Take 1 capsule (25 mg total) by mouth every 6 (six) hours as needed for itching.  Marland Kitchen glucagon (GLUCAGON EMERGENCY) 1 MG injection Inject 1 mg into the vein as directed.    . insulin aspart (NOVOLOG) 100 UNIT/ML injection Inject 45 Units into the skin daily. Via insulin pump   . losartan (COZAAR) 50 MG tablet Take 1 tablet (50 mg total) by mouth daily.  . mometasone (ASMANEX) 220 MCG/INH inhaler Inhale 2 puffs into the lungs daily.  . nitroGLYCERIN (NITROSTAT) 0.4 MG SL tablet Place 1 tablet (0.4 mg total) under the tongue every 5 (five) minutes as needed for chest pain.  . ONE TOUCH ULTRA TEST test strip as directed.  . rosuvastatin (CRESTOR) 20 MG tablet Take 20 mg by mouth daily.    . [DISCONTINUED] methylPREDNISolone (MEDROL DOSEPAK) 4 MG tablet follow package directions

## 2013-03-13 NOTE — Telephone Encounter (Signed)
Stop Cardizem and change to Amlodipine 10mg  daily.  Please have him see his PCP to make sure nothing else is going on besides a drug reaction.

## 2013-03-13 NOTE — Telephone Encounter (Signed)
New problem    Pt is having a reaction to careizen ext  240 mg,for the pass couple days face and flushing, hight blood sugar and some chest discomfort.  Pt has not taken it today yet.  Pt is still having some chest discomfort now.    Pt would like a call back on this please.  Pt would like to go back to  AMLODIPINE 5 mg

## 2013-03-14 ENCOUNTER — Ambulatory Visit (HOSPITAL_COMMUNITY): Payer: BC Managed Care – PPO | Attending: Cardiology | Admitting: Radiology

## 2013-03-14 DIAGNOSIS — E785 Hyperlipidemia, unspecified: Secondary | ICD-10-CM | POA: Insufficient documentation

## 2013-03-14 DIAGNOSIS — I77819 Aortic ectasia, unspecified site: Secondary | ICD-10-CM | POA: Insufficient documentation

## 2013-03-14 DIAGNOSIS — E119 Type 2 diabetes mellitus without complications: Secondary | ICD-10-CM | POA: Insufficient documentation

## 2013-03-14 DIAGNOSIS — R072 Precordial pain: Secondary | ICD-10-CM

## 2013-03-14 DIAGNOSIS — I10 Essential (primary) hypertension: Secondary | ICD-10-CM | POA: Insufficient documentation

## 2013-03-14 DIAGNOSIS — R079 Chest pain, unspecified: Secondary | ICD-10-CM

## 2013-03-14 NOTE — Progress Notes (Signed)
Echocardiogram performed.  

## 2013-03-15 ENCOUNTER — Telehealth: Payer: Self-pay | Admitting: General Surgery

## 2013-03-15 MED ORDER — IBUPROFEN 600 MG PO TABS: 600.0000 mg | ORAL_TABLET | Freq: Three times a day (TID) | ORAL | Status: AC

## 2013-03-15 MED ORDER — FAMOTIDINE 10 MG PO TABS: 10.0000 mg | ORAL_TABLET | Freq: Every day | ORAL | Status: DC

## 2013-03-15 NOTE — Telephone Encounter (Signed)
Message copied by Nita Sells on Thu Mar 15, 2013  4:58 PM ------      Message from: Armanda Magic R      Created: Wed Mar 14, 2013  5:04 PM       Please let patient know that echo showed normal LVF with increased stiffness of heart muscle.  He did have a small amount of fluid around his heart which indicates some inflammation of the lining of the heart called pericarditis.  This could be the etiology of his chest pain so I would like him to take Motrin 600mg  TID for 10 days along with OTC Pepcid 10mg  daily to protect his stomach.  I would like him to call after he has finished the medication to let me know if the CP resolved ------

## 2013-03-20 ENCOUNTER — Ambulatory Visit (HOSPITAL_COMMUNITY): Payer: BC Managed Care – PPO | Attending: Cardiology | Admitting: Radiology

## 2013-03-20 VITALS — BP 137/61 | Ht 70.0 in | Wt 138.0 lb

## 2013-03-20 DIAGNOSIS — R079 Chest pain, unspecified: Secondary | ICD-10-CM | POA: Insufficient documentation

## 2013-03-20 DIAGNOSIS — E785 Hyperlipidemia, unspecified: Secondary | ICD-10-CM | POA: Insufficient documentation

## 2013-03-20 DIAGNOSIS — J45909 Unspecified asthma, uncomplicated: Secondary | ICD-10-CM | POA: Insufficient documentation

## 2013-03-20 DIAGNOSIS — R0789 Other chest pain: Secondary | ICD-10-CM

## 2013-03-20 DIAGNOSIS — I10 Essential (primary) hypertension: Secondary | ICD-10-CM | POA: Insufficient documentation

## 2013-03-20 DIAGNOSIS — E119 Type 2 diabetes mellitus without complications: Secondary | ICD-10-CM | POA: Insufficient documentation

## 2013-03-20 DIAGNOSIS — Z8249 Family history of ischemic heart disease and other diseases of the circulatory system: Secondary | ICD-10-CM | POA: Insufficient documentation

## 2013-03-20 DIAGNOSIS — Z794 Long term (current) use of insulin: Secondary | ICD-10-CM | POA: Insufficient documentation

## 2013-03-20 MED ORDER — TECHNETIUM TC 99M SESTAMIBI GENERIC - CARDIOLITE
11.0000 | Freq: Once | INTRAVENOUS | Status: AC | PRN
  Administered 2013-03-20: 11 via INTRAVENOUS

## 2013-03-20 MED ORDER — TECHNETIUM TC 99M SESTAMIBI GENERIC - CARDIOLITE
33.0000 | Freq: Once | INTRAVENOUS | Status: AC | PRN
  Administered 2013-03-20: 33 via INTRAVENOUS

## 2013-03-20 NOTE — Progress Notes (Signed)
  [facility] SITE 3 NUCLEAR MED [address] [place], [place] [address] [phone]    Cardiology Nuclear Med Study  [name] [name] is a 52 y.o. male     MRN : [id number]     DOB: 1960-12-24  Procedure Date: 03/20/2013  Nuclear Med Background Indication for Stress Test:  Evaluation for Ischemia History:  Asthma and Cath 2006 N/O Dz '08 MPI: NL per pt, 12/14 ECHO: EF: NL Cardiac Risk Factors: Family History - CAD, Hypertension, IDDM Type 2 and Lipids  Symptoms:  Chest Pain   Nuclear Pre-Procedure Caffeine/Decaff Intake:  None NPO After: 7:00pm   Lungs:  clear O2 Sat: 100% on room air. IV 0.9% NS with Angio Cath:  22g  IV Site: R Hand  IV Started by:  Cathlyn Parsons, RN  Chest Size (in):  40 Cup Size: n/a  Height: 5\' 10"  (1.778 m)  Weight:  138 lb (62.596 kg)  BMI:  Body mass index is 19.8 kg/(m^2). Tech Comments:  CBG 180 this am,no insulin taken    Nuclear Med Study 1 or 2 day study: 1 day  Stress Test Type:  Stress  Reading MD: Armanda Magic, MD  Order Authorizing Provider:  Gevena Cotton  Resting Radionuclide: Technetium 53m Sestamibi  Resting Radionuclide Dose: 11.0 mCi   Stress Radionuclide:  Technetium 86m Sestamibi  Stress Radionuclide Dose: 33.0 mCi           Stress Protocol Rest HR: 73 Stress HR: 150  Rest BP: 137/61 Stress BP: 161/74  Exercise Time (min): 8:30 METS: 10.10   Predicted Max HR: 169 bpm % Max HR: 88.76 bpm Rate Pressure Product: 21308   Dose of Adenosine (mg):  n/a Dose of Lexiscan: n/a mg  Dose of Atropine (mg): n/a Dose of Dobutamine: n/a mcg/kg/min (at max HR)  Stress Test Technologist: Milana Na, EMT-P  Nuclear Technologist:  Domenic Polite, CNMT     Rest Procedure:  Myocardial perfusion imaging was performed at rest 45 minutes following the intravenous administration of Technetium 44m Sestamibi. Rest ECG: NSR - Normal EKG  Stress Procedure:  The patient exercised on the treadmill utilizing the Bruce  Protocol for 8:30 minutes. The patient stopped due to sob, fatigue, and denied any chest pain.  Technetium 73m Sestamibi was injected at peak exercise and myocardial perfusion imaging was performed after a brief delay. Stress ECG: No significant change from baseline ECG  QPS Raw Data Images:  Mild diaphragmatic attenuation.  Normal left ventricular size. Stress Images:  There is decreased uptake in the inferior wall. Rest Images:  There is decreased uptake in the inferior wall. Subtraction (SDS):  There is a fixed inferior defect that is most consistent with diaphragmatic attenuation. Transient Ischemic Dilatation (Normal <1.22):  0.93 Lung/Heart Ratio (Normal <0.45):  0.25  Quantitative Gated Spect Images QGS EDV:  75 ml QGS ESV:  17 ml  Impression Exercise Capacity:  Excellent exercise capacity. BP Response:  Normal blood pressure response. Clinical Symptoms:  There is dyspnea. ECG Impression:  No significant ST segment change suggestive of ischemia. Comparison with Prior Nuclear Study: No images to compare  Overall Impression:  Low risk stress nuclear study Fixed defect in inferior wall c/w diaphragmatic attenuation.  No ischemia noted..  LV Ejection Fraction: 77%.  LV Wall Motion:  NL LV Function; NL Wall Motion  Signed,  Armanda Magic, MD 03/21/2013

## 2013-03-21 NOTE — Progress Notes (Signed)
LVM for pt to return call

## 2013-03-26 ENCOUNTER — Encounter: Payer: Self-pay | Admitting: Cardiology

## 2013-03-26 ENCOUNTER — Ambulatory Visit (INDEPENDENT_AMBULATORY_CARE_PROVIDER_SITE_OTHER): Payer: BC Managed Care – PPO | Admitting: Cardiology

## 2013-03-26 VITALS — BP 138/70 | HR 92 | Ht 70.0 in | Wt 142.4 lb

## 2013-03-26 DIAGNOSIS — I10 Essential (primary) hypertension: Secondary | ICD-10-CM

## 2013-03-26 DIAGNOSIS — I2510 Atherosclerotic heart disease of native coronary artery without angina pectoris: Secondary | ICD-10-CM

## 2013-03-26 DIAGNOSIS — I309 Acute pericarditis, unspecified: Secondary | ICD-10-CM | POA: Insufficient documentation

## 2013-03-26 NOTE — Progress Notes (Signed)
[address] [place], [place]  [address] Phone: [phone] Fax:  [phone]  Date:  03/26/2013   ID:  [name] [name], DOB October 24, 1960, MRN [id number]  PCP:  Mickie Hillier, MD  Cardiologist:  Armanda Magic, MD    History of Present Illness: [name] [name] is a 52 y.o. male with a history of nonobstructive ASCAD, HTN and dyslipidemia who presents today for followup. He recently saw me with some atypical CP.  He was tachycardic and started on Coreg but had an allergic reaction with hand swelling and was treated with a Medrol dose pack and Coreg was stopped and he was started on Cardizem instead but with that he had facial flushing and elevated BS with some chest discomfort.  The Cardizem was stopped and he restarted amlodipine.  He underwent nuclear stress test which showed no ischemia and echo showed a small pericardial effusion which was felt to be the etiology of the CP.  He was started on Motrin 600mg  TID for 10 days and now presents back for followup.   Wt Readings from Last 3 Encounters:  03/20/13 138 lb (62.596 kg)  03/13/13 142 lb (64.411 kg)  03/07/13 139 lb (63.05 kg)     Past Medical History  Diagnosis Date  . Type I diabetes mellitus   . Asthma   . Dyslipidemia   . Anemia   . Nephrolithiasis   . Coronary artery disease 07/17/2004    nonobstructive ASCAD with 40-50% LAD  . Hyperlipidemia   . Hypertension     Current Outpatient Prescriptions  Medication Sig Dispense Refill  . albuterol (PROVENTIL HFA;VENTOLIN HFA) 108 (90 BASE) MCG/ACT inhaler Inhale 2 puffs into the lungs every 4 (four) hours as needed.        Marland Kitchen amLODipine (NORVASC) 10 MG tablet Take 1 tablet (10 mg total) by mouth daily.  30 tablet  6  . aspirin 325 MG tablet Take 325 mg by mouth daily.        . benzonatate (TESSALON) 100 MG capsule Use 1-2 every 4- 6  Hours as needed for cough  90 capsule  4  . diphenhydrAMINE (BENADRYL) 25 mg capsule Take 1 capsule (25 mg total) by mouth  every 6 (six) hours as needed for itching.  30 capsule  0  . famotidine (PEPCID AC) 10 MG tablet Take 1 tablet (10 mg total) by mouth daily. For ten days with Motrin      . glucagon (GLUCAGON EMERGENCY) 1 MG injection Inject 1 mg into the vein as directed.        . insulin aspart (NOVOLOG) 100 UNIT/ML injection Inject 45 Units into the skin daily. Via insulin pump       . losartan (COZAAR) 50 MG tablet Take 1 tablet (50 mg total) by mouth daily.  30 tablet  6  . mometasone (ASMANEX) 220 MCG/INH inhaler Inhale 2 puffs into the lungs daily.  1 Inhaler    . nitroGLYCERIN (NITROSTAT) 0.4 MG SL tablet Place 1 tablet (0.4 mg total) under the tongue every 5 (five) minutes as needed for chest pain.  90 tablet  3  . ONE TOUCH ULTRA TEST test strip as directed.      . rosuvastatin (CRESTOR) 20 MG tablet Take 20 mg by mouth daily.         No current facility-administered medications for this visit.    Allergies:    Allergies  Allergen Reactions  . Carvedilol Hives    Hives and Itching  .  Levofloxacin     stomach upset    Social History:  The patient  reports that he has never smoked. He has never used smokeless tobacco. He reports that he drinks alcohol. He reports that he does not use illicit drugs.   Family History:  The patient's family history includes Cancer in his maternal aunt and maternal uncle; Coronary artery disease in his father; Diabetes in his brother, father, and paternal grandmother; Hypertension in his father; Hypothyroidism in his sister; Lung cancer in his mother.   ROS:  Please see the history of present illness.      All other systems reviewed and negative.   PHYSICAL EXAM: VS:  There were no vitals taken for this visit. Well nourished, well developed, in no acute distress HEENT: normal Neck: no JVD Cardiac:  normal S1, S2; RRR; no murmur Lungs:  clear to auscultation bilaterally, no wheezing, rhonchi or rales Abd: soft, nontender, no hepatomegaly Ext: no edema Skin:  warm and dry Neuro:  CNs 2-12 intact, no focal abnormalities noted      ASSESSMENT AND PLAN:  1. Acute Pericarditis - resolved - he got a URI shortly after the CP so it may have been a viral URI  - we discussed etiologies of pericarditis and natural history.  I did inform him that he is at increased risk of reoccurence so he is to let me know if he gets the symptoms again 2. Atypical CP secondary to #1 - resolved 3.   HTN   - continue amlodipine/Losartan 4.   Nonobstructive ASCAD   - continue ASA  Followup at scheduled appt in May  Signed, Melaney Tellefsen, MD 03/26/2013 9:40 AM

## 2013-03-26 NOTE — Patient Instructions (Signed)
Your physician recommends that you continue on your current medications as directed. Please refer to the Current Medication list given to you today.     

## 2013-03-28 ENCOUNTER — Other Ambulatory Visit: Payer: Self-pay | Admitting: Critical Care Medicine

## 2013-04-29 ENCOUNTER — Other Ambulatory Visit: Payer: Self-pay | Admitting: Critical Care Medicine

## 2013-05-31 ENCOUNTER — Other Ambulatory Visit: Payer: Self-pay | Admitting: Critical Care Medicine

## 2013-07-02 ENCOUNTER — Other Ambulatory Visit: Payer: Self-pay | Admitting: Critical Care Medicine

## 2013-07-05 ENCOUNTER — Other Ambulatory Visit: Payer: BC Managed Care – PPO

## 2013-07-17 ENCOUNTER — Ambulatory Visit (INDEPENDENT_AMBULATORY_CARE_PROVIDER_SITE_OTHER): Payer: BC Managed Care – PPO | Admitting: *Deleted

## 2013-07-17 DIAGNOSIS — E119 Type 2 diabetes mellitus without complications: Secondary | ICD-10-CM

## 2013-07-17 DIAGNOSIS — Z79899 Other long term (current) drug therapy: Secondary | ICD-10-CM

## 2013-07-17 DIAGNOSIS — E1065 Type 1 diabetes mellitus with hyperglycemia: Principal | ICD-10-CM

## 2013-07-17 DIAGNOSIS — E7800 Pure hypercholesterolemia, unspecified: Secondary | ICD-10-CM

## 2013-07-17 DIAGNOSIS — E1049 Type 1 diabetes mellitus with other diabetic neurological complication: Secondary | ICD-10-CM

## 2013-07-17 LAB — ALT: ALT: 29 U/L (ref 0–53)

## 2013-07-17 LAB — HEMOGLOBIN A1C: Hgb A1c MFr Bld: 10.1 % — ABNORMAL HIGH (ref 4.6–6.5)

## 2013-07-18 LAB — NMR LIPOPROFILE WITH LIPIDS
Cholesterol, Total: 118 mg/dL (ref <200)
HDL Particle Number: 37.7 umol/L (ref >=30.5)
HDL Size: 9.5 nm (ref >=9.2)
HDL-C: 58 mg/dL (ref >=40)
LDL (calc): 49 mg/dL (ref <100)
LDL Particle Number: 706 nmol/L (ref <1000)
LDL Size: 20.3 nm — ABNORMAL LOW (ref >20.5)
LP-IR Score: 33 (ref <=45)
Large HDL-P: 8.3 umol/L (ref >=4.8)
Large VLDL-P: 2 nmol/L (ref <=2.7)
Small LDL Particle Number: 384 nmol/L (ref <=527)
Triglycerides: 53 mg/dL (ref <150)
VLDL Size: 43.7 nm (ref <=46.6)

## 2013-07-20 ENCOUNTER — Encounter: Payer: Self-pay | Admitting: General Surgery

## 2013-07-20 ENCOUNTER — Other Ambulatory Visit: Payer: Self-pay | Admitting: General Surgery

## 2013-07-20 DIAGNOSIS — E785 Hyperlipidemia, unspecified: Secondary | ICD-10-CM

## 2013-07-20 MED ORDER — ROSUVASTATIN CALCIUM 40 MG PO TABS: 20.0000 mg | ORAL_TABLET | Freq: Every day | ORAL | Status: DC

## 2013-08-21 ENCOUNTER — Ambulatory Visit (INDEPENDENT_AMBULATORY_CARE_PROVIDER_SITE_OTHER): Payer: BC Managed Care – PPO | Admitting: Cardiology

## 2013-08-21 ENCOUNTER — Encounter: Payer: Self-pay | Admitting: Cardiology

## 2013-08-21 VITALS — BP 133/72 | HR 80 | Ht 70.0 in | Wt 144.0 lb

## 2013-08-21 DIAGNOSIS — I2510 Atherosclerotic heart disease of native coronary artery without angina pectoris: Secondary | ICD-10-CM

## 2013-08-21 DIAGNOSIS — I10 Essential (primary) hypertension: Secondary | ICD-10-CM

## 2013-08-21 DIAGNOSIS — E785 Hyperlipidemia, unspecified: Secondary | ICD-10-CM

## 2013-08-21 NOTE — Patient Instructions (Signed)
Your physician wants you to follow-up in: 6 MONTHS WITH DR TURNER You will receive a reminder letter in the mail two months in advance. If you don't receive a letter, please call our office to schedule the follow-up appointment.  

## 2013-08-21 NOTE — Progress Notes (Signed)
[place] [place], [place]  [address] Phone: [phone] Fax:  [phone]  Date:  08/21/2013   ID:  [name] [name], DOB 08-01-1960, MRN [id number]  PCP:  Gennette Pac, MD  Cardiologist:  Fransico Him, MD     History of Present Illness: [name] [name] is a 53 y.o. male with a history of nonobstructive ASCAD, HTN and dyslipidemia who presents today for followup. He recently saw me with some atypical CP. He was tachycardic and started on Coreg but had an allergic reaction with hand swelling and was treated with a Medrol dose pack and Coreg was stopped and he was started on Cardizem instead but with that he had facial flushing and elevated BS with some chest discomfort. The Cardizem was stopped and he restarted amlodipine. He underwent nuclear stress test which showed no ischemia and echo showed a small pericardial effusion which was felt to be the etiology of the CP. He was started on Motrin 600mg  TID for 10 days with resolution of his chest pain.  He now presents back for followup.  He is doing well.  He denies any chest pain, SOB, DOE, dizziness, palpitations or syncope.  He complains of LE edema occasionally.     Wt Readings from Last 3 Encounters:  08/21/13 144 lb (65.318 kg)  03/26/13 142 lb 6.4 oz (64.592 kg)  03/20/13 138 lb (62.596 kg)     Past Medical History  Diagnosis Date  . Type I diabetes mellitus   . Asthma   . Dyslipidemia   . Anemia   . Nephrolithiasis   . Coronary artery disease 07/17/2004    nonobstructive ASCAD with 40-50% LAD  . Hyperlipidemia   . Hypertension     Current Outpatient Prescriptions  Medication Sig Dispense Refill  . albuterol (PROVENTIL HFA;VENTOLIN HFA) 108 (90 BASE) MCG/ACT inhaler Inhale 2 puffs into the lungs every 4 (four) hours as needed.        Marland Kitchen amLODipine (NORVASC) 10 MG tablet Take 1 tablet (10 mg total) by mouth daily.  30 tablet  6  . aspirin 325 MG tablet Take 325 mg by mouth daily.        . benzonatate  (TESSALON) 100 MG capsule Use 1-2 every 4- 6  Hours as needed for cough  90 capsule  4  . diphenhydrAMINE (BENADRYL) 25 mg capsule Take 1 capsule (25 mg total) by mouth every 6 (six) hours as needed for itching.  30 capsule  0  . famotidine (PEPCID AC) 10 MG tablet Take 1 tablet (10 mg total) by mouth daily. For ten days with Motrin      . glucagon (GLUCAGON EMERGENCY) 1 MG injection Inject 1 mg into the vein as directed.        . insulin aspart (NOVOLOG) 100 UNIT/ML injection Inject 45 Units into the skin daily. Via insulin pump       . LANTUS 100 UNIT/ML injection as needed.      Marland Kitchen losartan (COZAAR) 50 MG tablet TAKE 1 TABLET (50 MG TOTAL) BY MOUTH DAILY.  30 tablet  5  . mometasone (ASMANEX) 220 MCG/INH inhaler Inhale 2 puffs into the lungs daily.  1 Inhaler    . nitroGLYCERIN (NITROSTAT) 0.4 MG SL tablet Place 1 tablet (0.4 mg total) under the tongue every 5 (five) minutes as needed for chest pain.  90 tablet  3  . ONE TOUCH ULTRA TEST test strip as directed.      . rosuvastatin (CRESTOR) 40  MG tablet Take 0.5 tablets (20 mg total) by mouth daily.       No current facility-administered medications for this visit.    Allergies:    Allergies  Allergen Reactions  . Cardizem [Diltiazem Hcl]     Flushing and chest pressure  . Carvedilol Hives    Hives and Itching  . Levofloxacin     stomach upset    Social History:  The patient  reports that he has never smoked. He has never used smokeless tobacco. He reports that he drinks alcohol. He reports that he does not use illicit drugs.   Family History:  The patient's family history includes Cancer in his maternal aunt and maternal uncle; Coronary artery disease in his father; Diabetes in his brother, father, and paternal grandmother; Hypertension in his father; Hypothyroidism in his sister; Lung cancer in his mother.   ROS:  Please see the history of present illness.      All other systems reviewed and negative.   PHYSICAL EXAM: VS:  BP  133/72  Pulse 80  Ht 5\' 10"  (1.778 m)  Wt 144 lb (65.318 kg)  BMI 20.66 kg/m2 Well nourished, well developed, in no acute distress HEENT: normal Neck: no JVD Cardiac:  normal S1, S2; RRR; no murmur Lungs:  clear to auscultation bilaterally, no wheezing, rhonchi or rales Abd: soft, nontender, no hepatomegaly Ext: no edema Skin: warm and dry Neuro:  CNs 2-12 intact, no focal abnormalities noted  ASSESSMENT AND PLAN:  1.  Acute Pericarditis - resolved - he got a URI shortly after the CP so it may have been a viral URI - we discussed etiologies of pericarditis and natural history. I did inform him that he is at increased risk of reoccurence so he is to let me know if he gets the symptoms again  2.  Atypical CP secondary to #1 - resolved 3. HTN - well controlled - continue amlodipine/Losartan  4. Nonobstructive ASCAD  - continue ASA and decrease to 81mg  daily 5.  Dyslipidemia - lipids at goal - continue crestor  Followup with me in 6 months  Signed, Fransico Him, MD 08/21/2013 1:30 PM

## 2013-08-30 ENCOUNTER — Other Ambulatory Visit: Payer: Self-pay

## 2013-08-30 MED ORDER — ROSUVASTATIN CALCIUM 40 MG PO TABS: 20.0000 mg | ORAL_TABLET | Freq: Every day | ORAL | Status: DC

## 2013-09-04 ENCOUNTER — Encounter: Payer: Self-pay | Admitting: Cardiology

## 2013-10-22 ENCOUNTER — Other Ambulatory Visit: Payer: Self-pay

## 2013-10-22 DIAGNOSIS — I10 Essential (primary) hypertension: Secondary | ICD-10-CM

## 2013-10-22 MED ORDER — AMLODIPINE BESYLATE 10 MG PO TABS
10.0000 mg | ORAL_TABLET | Freq: Every day | ORAL | Status: DC
Start: 2013-10-22 — End: 2014-05-15

## 2013-12-11 ENCOUNTER — Other Ambulatory Visit (HOSPITAL_COMMUNITY): Payer: Self-pay | Admitting: Urology

## 2013-12-11 ENCOUNTER — Other Ambulatory Visit (HOSPITAL_COMMUNITY): Payer: Self-pay | Admitting: Surgery

## 2013-12-11 DIAGNOSIS — N508 Other specified disorders of male genital organs: Secondary | ICD-10-CM

## 2013-12-13 ENCOUNTER — Ambulatory Visit (HOSPITAL_COMMUNITY)
Admission: RE | Admit: 2013-12-13 | Discharge: 2013-12-13 | Disposition: A | Discharge status: Home / Self Care | Payer: BC Managed Care – PPO | Source: Ambulatory Visit | Attending: Surgery | Admitting: Surgery

## 2013-12-13 ENCOUNTER — Encounter (HOSPITAL_BASED_OUTPATIENT_CLINIC_OR_DEPARTMENT_OTHER): Payer: Self-pay | Admitting: *Deleted

## 2013-12-13 ENCOUNTER — Other Ambulatory Visit: Payer: Self-pay | Admitting: Urology

## 2013-12-13 DIAGNOSIS — N508 Other specified disorders of male genital organs: Secondary | ICD-10-CM

## 2013-12-13 NOTE — Progress Notes (Signed)
Chart reviewed by Dr. Jillyn Hidden. [place]art for pt to continue pump. If is cbg is low in am, he may have apple jucie up  Until 0530.  Pt informed and verbalized his understanding.

## 2013-12-13 NOTE — Anesthesia Preprocedure Evaluation (Addendum)
Anesthesia Evaluation  Patient identified by MRN, date of birth, ID band Patient awake    Reviewed: Allergy & Precautions, H&P , NPO status , Patient's Chart, lab work & pertinent test results  Airway Mallampati: II TM Distance: >3 FB Neck ROM: Full    Dental no notable dental hx.    Pulmonary asthma ,  breath sounds clear to auscultation  Pulmonary exam normal       Cardiovascular hypertension, Pt. on medications + CAD Rhythm:Regular Rate:Normal  Echo 03/2013 - Left ventricle: The cavity size was normal. Systolic   function was normal. The estimated ejection fraction was   in the range of 55% to 60%. Wall motion was normal; there were no regional wall motion abnormalities. There was an increased relative contribution of atrial contraction to ventricular filling. Doppler parameters are consistent with abnormal left ventricular relaxation (grade 1 diastolic dysfunction). - Pericardium, extracardiac: A trivial, free-flowing   pericardial effusion was identified along the right   ventricular free wall and along the right atrial free   wall. There was no evidence of hemodynamic compromise.    Neuro/Psych negative neurological ROS  negative psych ROS   GI/Hepatic negative GI ROS, Neg liver ROS,   Endo/Other  negative endocrine ROSdiabetes  Renal/GU Renal disease     Musculoskeletal negative musculoskeletal ROS (+)   Abdominal   Peds  Hematology  (+) Blood dyscrasia, anemia ,   Anesthesia Other Findings   Reproductive/Obstetrics                          Anesthesia Physical Anesthesia Plan  ASA: II  Anesthesia Plan: General   Post-op Pain Management:    Induction: Intravenous  Airway Management Planned: LMA  Additional Equipment:   Intra-op Plan:   Post-operative Plan: Extubation in OR  Informed Consent: I have reviewed the patients History and Physical, chart, labs and discussed the  procedure including the risks, benefits and alternatives for the proposed anesthesia with the patient or authorized representative who has indicated his/her understanding and acceptance.   Dental advisory given  Plan Discussed with: CRNA  Anesthesia Plan Comments:         Anesthesia Quick Evaluation

## 2013-12-13 NOTE — Progress Notes (Signed)
Barnie Alderman , diabetes coordinator informed of pt arriving @ 0600 w/ insulin pump. If case is longer than 2 hrs, she recommends turning pump off and use insulin drip

## 2013-12-13 NOTE — Progress Notes (Addendum)
Pt instructed npo p mn tonight x norvasc,losartan, w sip of water. [facility] [place] @ 0600. Pt to use asmanex and albuterol in am and bring albuterol w him.  ekg w chart. Needs istat on arrival. Pt to continue insulin pump. Will have anesthesia review chart. Will call pt back if there are any changes.

## 2013-12-13 NOTE — Progress Notes (Signed)
Dr. Jillyn Hidden asked to review chart

## 2013-12-14 ENCOUNTER — Encounter (HOSPITAL_BASED_OUTPATIENT_CLINIC_OR_DEPARTMENT_OTHER)
Admission: RE | Disposition: A | Discharge status: Home / Self Care | Payer: Self-pay | Source: Ambulatory Visit | Attending: Urology

## 2013-12-14 ENCOUNTER — Ambulatory Visit (HOSPITAL_BASED_OUTPATIENT_CLINIC_OR_DEPARTMENT_OTHER): Payer: BC Managed Care – PPO | Admitting: Anesthesiology

## 2013-12-14 ENCOUNTER — Encounter (HOSPITAL_BASED_OUTPATIENT_CLINIC_OR_DEPARTMENT_OTHER): Payer: BC Managed Care – PPO | Admitting: Anesthesiology

## 2013-12-14 ENCOUNTER — Encounter (HOSPITAL_BASED_OUTPATIENT_CLINIC_OR_DEPARTMENT_OTHER): Payer: Self-pay | Admitting: *Deleted

## 2013-12-14 ENCOUNTER — Ambulatory Visit (HOSPITAL_BASED_OUTPATIENT_CLINIC_OR_DEPARTMENT_OTHER)
Admission: RE | Admit: 2013-12-14 | Discharge: 2013-12-14 | Disposition: A | Discharge status: Home / Self Care | Payer: BC Managed Care – PPO | Source: Ambulatory Visit | Attending: Urology | Admitting: Urology

## 2013-12-14 DIAGNOSIS — N5089 Other specified disorders of the male genital organs: Secondary | ICD-10-CM

## 2013-12-14 DIAGNOSIS — Z7982 Long term (current) use of aspirin: Secondary | ICD-10-CM | POA: Insufficient documentation

## 2013-12-14 DIAGNOSIS — E7800 Pure hypercholesterolemia, unspecified: Secondary | ICD-10-CM | POA: Insufficient documentation

## 2013-12-14 DIAGNOSIS — I10 Essential (primary) hypertension: Secondary | ICD-10-CM | POA: Insufficient documentation

## 2013-12-14 DIAGNOSIS — N508 Other specified disorders of male genital organs: Secondary | ICD-10-CM | POA: Diagnosis not present

## 2013-12-14 DIAGNOSIS — E119 Type 2 diabetes mellitus without complications: Secondary | ICD-10-CM | POA: Diagnosis not present

## 2013-12-14 DIAGNOSIS — J45909 Unspecified asthma, uncomplicated: Secondary | ICD-10-CM | POA: Insufficient documentation

## 2013-12-14 DIAGNOSIS — N200 Calculus of kidney: Secondary | ICD-10-CM | POA: Insufficient documentation

## 2013-12-14 DIAGNOSIS — Z79899 Other long term (current) drug therapy: Secondary | ICD-10-CM | POA: Insufficient documentation

## 2013-12-14 DIAGNOSIS — D759 Disease of blood and blood-forming organs, unspecified: Secondary | ICD-10-CM | POA: Diagnosis not present

## 2013-12-14 DIAGNOSIS — I2510 Atherosclerotic heart disease of native coronary artery without angina pectoris: Secondary | ICD-10-CM | POA: Diagnosis not present

## 2013-12-14 HISTORY — PX: ORCHIECTOMY: SHX2116

## 2013-12-14 LAB — GLUCOSE, CAPILLARY: Glucose-Capillary: 240 mg/dL — ABNORMAL HIGH (ref 70–99)

## 2013-12-14 LAB — POCT I-STAT 4, (NA,K, GLUC, HGB,HCT)
Glucose, Bld: 276 mg/dL — ABNORMAL HIGH (ref 70–99)
HCT: 44 % (ref 39.0–52.0)
Hemoglobin: 15 g/dL (ref 13.0–17.0)
Potassium: 4.1 mEq/L (ref 3.7–5.3)
Sodium: 137 mEq/L (ref 137–147)

## 2013-12-14 SURGERY — ORCHIECTOMY
Anesthesia: General | Site: Scrotum | Laterality: Right

## 2013-12-14 MED ORDER — ACETAMINOPHEN 10 MG/ML IV SOLN
INTRAVENOUS | Status: DC | PRN
  Administered 2013-12-14: 1000 mg via INTRAVENOUS

## 2013-12-14 MED ORDER — HYDROMORPHONE HCL PF 1 MG/ML IJ SOLN
INTRAMUSCULAR | Status: AC
  Filled 2013-12-14: qty 1

## 2013-12-14 MED ORDER — OXYCODONE HCL 5 MG PO TABS
5.0000 mg | ORAL_TABLET | Freq: Once | ORAL | Status: AC | PRN
  Administered 2013-12-14: 5 mg via ORAL
  Filled 2013-12-14: qty 1

## 2013-12-14 MED ORDER — CEFAZOLIN SODIUM 1-5 GM-% IV SOLN
1.0000 g | INTRAVENOUS | Status: DC
  Filled 2013-12-14: qty 50

## 2013-12-14 MED ORDER — PROMETHAZINE HCL 25 MG/ML IJ SOLN
6.2500 mg | INTRAMUSCULAR | Status: DC | PRN
  Filled 2013-12-14: qty 1

## 2013-12-14 MED ORDER — CEFAZOLIN SODIUM-DEXTROSE 2-3 GM-% IV SOLR
INTRAVENOUS | Status: AC
  Filled 2013-12-14: qty 50

## 2013-12-14 MED ORDER — PROPOFOL 10 MG/ML IV BOLUS
INTRAVENOUS | Status: DC | PRN
  Administered 2013-12-14: 200 mg via INTRAVENOUS

## 2013-12-14 MED ORDER — DEXAMETHASONE SODIUM PHOSPHATE 4 MG/ML IJ SOLN
INTRAMUSCULAR | Status: DC | PRN
  Administered 2013-12-14: 4 mg via INTRAVENOUS

## 2013-12-14 MED ORDER — SODIUM CHLORIDE 0.9 % IR SOLN
Status: DC | PRN
  Administered 2013-12-14: 08:00:00

## 2013-12-14 MED ORDER — KETOROLAC TROMETHAMINE 30 MG/ML IJ SOLN
INTRAMUSCULAR | Status: DC | PRN
  Administered 2013-12-14: 30 mg via INTRAVENOUS

## 2013-12-14 MED ORDER — DOCUSATE SODIUM 100 MG PO CAPS: 100.0000 mg | ORAL_CAPSULE | Freq: Two times a day (BID) | ORAL | Status: DC | PRN

## 2013-12-14 MED ORDER — OXYCODONE HCL 5 MG PO TABS: 5.0000 mg | ORAL_TABLET | ORAL | Status: DC | PRN

## 2013-12-14 MED ORDER — FENTANYL CITRATE 0.05 MG/ML IJ SOLN
INTRAMUSCULAR | Status: AC
  Filled 2013-12-14: qty 4

## 2013-12-14 MED ORDER — OXYCODONE HCL 5 MG/5ML PO SOLN
5.0000 mg | Freq: Once | ORAL | Status: AC | PRN
  Filled 2013-12-14: qty 5

## 2013-12-14 MED ORDER — EPHEDRINE SULFATE 50 MG/ML IJ SOLN
INTRAMUSCULAR | Status: DC | PRN
  Administered 2013-12-14: 10 mg via INTRAVENOUS

## 2013-12-14 MED ORDER — CEFAZOLIN SODIUM-DEXTROSE 2-3 GM-% IV SOLR
2.0000 g | INTRAVENOUS | Status: AC
  Administered 2013-12-14: 2 g via INTRAVENOUS
  Filled 2013-12-14: qty 50

## 2013-12-14 MED ORDER — FENTANYL CITRATE 0.05 MG/ML IJ SOLN
INTRAMUSCULAR | Status: DC | PRN
  Administered 2013-12-14 (×2): 50 ug via INTRAVENOUS

## 2013-12-14 MED ORDER — MIDAZOLAM HCL 5 MG/5ML IJ SOLN
INTRAMUSCULAR | Status: DC | PRN
  Administered 2013-12-14: 2 mg via INTRAVENOUS

## 2013-12-14 MED ORDER — ASPIRIN 325 MG PO TABS: 325.0000 mg | ORAL_TABLET | Freq: Every day | ORAL | Status: DC

## 2013-12-14 MED ORDER — LIDOCAINE HCL (CARDIAC) 20 MG/ML IV SOLN
INTRAVENOUS | Status: DC | PRN
  Administered 2013-12-14: 100 mg via INTRAVENOUS

## 2013-12-14 MED ORDER — HYDROMORPHONE HCL PF 1 MG/ML IJ SOLN
0.2500 mg | INTRAMUSCULAR | Status: DC | PRN
  Administered 2013-12-14: 0.5 mg via INTRAVENOUS
  Filled 2013-12-14: qty 1

## 2013-12-14 MED ORDER — LACTATED RINGERS IV SOLN
INTRAVENOUS | Status: DC
  Administered 2013-12-14 (×2): via INTRAVENOUS
  Filled 2013-12-14: qty 1000

## 2013-12-14 MED ORDER — MIDAZOLAM HCL 2 MG/2ML IJ SOLN
INTRAMUSCULAR | Status: AC
  Filled 2013-12-14: qty 2

## 2013-12-14 MED ORDER — INSULIN ASPART 100 UNIT/ML ~~LOC~~ SOLN
SUBCUTANEOUS | Status: DC | PRN
  Administered 2013-12-14: 1 [IU] via SUBCUTANEOUS

## 2013-12-14 MED ORDER — OXYCODONE HCL 5 MG PO TABS
ORAL_TABLET | ORAL | Status: AC
  Filled 2013-12-14: qty 1

## 2013-12-14 MED ORDER — MEPERIDINE HCL 25 MG/ML IJ SOLN
6.2500 mg | INTRAMUSCULAR | Status: DC | PRN
  Filled 2013-12-14: qty 1

## 2013-12-14 MED ORDER — BUPIVACAINE HCL (PF) 0.25 % IJ SOLN
INTRAMUSCULAR | Status: DC | PRN
  Administered 2013-12-14: 10 mL

## 2013-12-14 SURGICAL SUPPLY — 52 items
ADH SKN CLS APL DERMABOND .7 (GAUZE/BANDAGES/DRESSINGS) ×1
BD VACUTAINER ×1 IMPLANT
BLADE HEX COATED 2.75 (ELECTRODE) ×2 IMPLANT
BLADE SURG 10 STRL SS (BLADE) IMPLANT
BLADE SURG 15 STRL LF DISP TIS (BLADE) ×1 IMPLANT
BLADE SURG 15 STRL SS (BLADE) ×2
BNDG GAUZE ELAST 4 BULKY (GAUZE/BANDAGES/DRESSINGS) ×2 IMPLANT
CANISTER SUCT LVC 12 LTR MEDI- (MISCELLANEOUS) ×1 IMPLANT
CANISTER SUCTION 2500CC (MISCELLANEOUS) ×1 IMPLANT
CLOTH BEACON ORANGE TIMEOUT ST (SAFETY) ×1 IMPLANT
COVER MAYO STAND STRL (DRAPES) ×2 IMPLANT
COVER TABLE BACK 60X90 (DRAPES) ×2 IMPLANT
DERMABOND ADVANCED (GAUZE/BANDAGES/DRESSINGS) ×1
DERMABOND ADVANCED .7 DNX12 (GAUZE/BANDAGES/DRESSINGS) ×1 IMPLANT
DISSECTOR ROUND CHERRY 3/8 STR (MISCELLANEOUS) ×2 IMPLANT
DRAIN PENROSE 18X1/4 LTX STRL (WOUND CARE) IMPLANT
DRAPE PED LAPAROTOMY (DRAPES) ×2 IMPLANT
ELECT REM PT RETURN 9FT ADLT (ELECTROSURGICAL) ×2
ELECTRODE REM PT RTRN 9FT ADLT (ELECTROSURGICAL) ×1 IMPLANT
GAUZE SPONGE 4X4 16PLY XRAY LF (GAUZE/BANDAGES/DRESSINGS) ×1 IMPLANT
GLOVE BIO SURGEON STRL SZ7 (GLOVE) ×1 IMPLANT
GLOVE BIO SURGEON STRL SZ7.5 (GLOVE) ×2 IMPLANT
GLOVE BIOGEL PI IND STRL 7.5 (GLOVE) IMPLANT
GLOVE BIOGEL PI INDICATOR 7.5 (GLOVE) ×5
GOWN PREVENTION PLUS LG XLONG (DISPOSABLE) ×1 IMPLANT
GOWN PREVENTION PLUS XLARGE (GOWN DISPOSABLE) ×1 IMPLANT
GOWN STRL REUS W/TWL XL LVL3 (GOWN DISPOSABLE) ×6 IMPLANT
NEEDLE HYPO 22GX1.5 SAFETY (NEEDLE) ×2 IMPLANT
NS IRRIG 500ML POUR BTL (IV SOLUTION) ×2 IMPLANT
PACK BASIN DAY SURGERY FS (CUSTOM PROCEDURE TRAY) ×2 IMPLANT
PENCIL BUTTON HOLSTER BLD 10FT (ELECTRODE) ×2 IMPLANT
PROSTHESIS TESTICULAR NAC MED (Urological Implant) IMPLANT
SPONGE LAP 4X18 X RAY DECT (DISPOSABLE) IMPLANT
SUPPORT SCROTAL LG STRP (MISCELLANEOUS) ×2 IMPLANT
SUT MNCRL AB 4-0 PS2 18 (SUTURE) ×2 IMPLANT
SUT PROLENE 2 0 SH DA (SUTURE) ×2 IMPLANT
SUT SILK 2 0 (SUTURE) ×2
SUT SILK 2 0 SH (SUTURE) ×2 IMPLANT
SUT SILK 2-0 18XBRD TIE 12 (SUTURE) IMPLANT
SUT VIC AB 3-0 SH 27 (SUTURE) ×4
SUT VIC AB 3-0 SH 27X BRD (SUTURE) ×2 IMPLANT
SUT VICRYL 2 0 18  UND BR (SUTURE)
SUT VICRYL 2 0 18 UND BR (SUTURE) ×1 IMPLANT
SYR BULB IRRIGATION 50ML (SYRINGE) ×2 IMPLANT
SYR CONTROL 10ML LL (SYRINGE) ×2 IMPLANT
SYRINGE 20CC LL (MISCELLANEOUS) ×1 IMPLANT
TESTICULAR PROSTHESIS NAC MED (Urological Implant) ×2 IMPLANT
TOWEL OR 17X24 6PK STRL BLUE (TOWEL DISPOSABLE) ×4 IMPLANT
TRAY DSU PREP LF (CUSTOM PROCEDURE TRAY) ×2 IMPLANT
TUBE CONNECTING 12X1/4 (SUCTIONS) ×2 IMPLANT
WATER STERILE IRR 500ML POUR (IV SOLUTION) IMPLANT
YANKAUER SUCT BULB TIP NO VENT (SUCTIONS) ×2 IMPLANT

## 2013-12-14 NOTE — Transfer of Care (Signed)
Immediate Anesthesia Transfer of Care Note  Patient: [name] [name]  Procedure(s) Performed: Procedure(s): RIGHT RADICAL ORCHIECTOMY WITH PLACEMENT OF TESTICULAR PROTHESIS (Right)  Patient Location: PACU  Anesthesia Type:General  Level of Consciousness: sedated and responds to stimulation  Airway & Oxygen Therapy: Patient Spontanous Breathing and Patient connected to nasal cannula oxygen  Post-op Assessment: Report given to PACU RN  Post vital signs: Reviewed and stable  Complications: No apparent anesthesia complications

## 2013-12-14 NOTE — Anesthesia Procedure Notes (Signed)
Procedure Name: LMA Insertion Date/Time: 12/14/2013 7:42 AM Performed by: Bethena Roys T Pre-anesthesia Checklist: Patient identified, Emergency Drugs available, Suction available and Patient being monitored Patient Re-evaluated:Patient Re-evaluated prior to inductionOxygen Delivery Method: Circle System Utilized Preoxygenation: Pre-oxygenation with 100% oxygen Intubation Type: IV induction Ventilation: Mask ventilation without difficulty LMA: LMA with gastric port inserted LMA Size: 4.0 Number of attempts: 1 Placement Confirmation: positive ETCO2 Tube secured with: Tape Dental Injury: Teeth and Oropharynx as per pre-operative assessment

## 2013-12-14 NOTE — H&P (Signed)
Reason For Visit Follow-up for scrotal ultrasound and KUB   History of Present Illness The patient presents to me approximately 1 year from his last visit where he was seen for chronic prostatitis. He was treated with 4 weeks of Bactrim and his symptoms improved. Today he presents with 2 separate issues.  #1 nephrolithiasis: Patient has an extensive history of kidney stones and has been monitored here in this practice on an annual basis with KUBs. However, the patient has not had a KUB in the last 10 years. Further, the patient denies any flank pain or gross hematuria. He does not recall passing any stones in the interim. He is otherwise pain-free and asymptomatic from this.    #2 peri-testicular lesion: The patient noted this recently. It is a small flat hard region on the surface of the posterior aspect of the testicle. It is not mobile or particularly painful.     Interval: The patient follows up today having undergone a KUB and a scrotal ultrasound. He denies any flank pain or gross hematuria. He denies any testicular pain or discomfort.   Past Medical History Problems  1. History of Asthma (493.90) 2. History of diabetes mellitus (V12.29) 3. History of hypercholesterolemia (V12.29) 4. History of hypertension (V12.59)  Surgical History Problems  1. History of Back Surgery  Current Meds 1. AmLODIPine Besylate 5 MG Oral Tablet;  Therapy: (Recorded:29Aug2014) to Recorded 2. Apidra 100 UNIT/ML Injection Solution;  Therapy: (Recorded:29Aug2014) to Recorded 3. Asmanex 120 Metered Doses 220 MCG/INH Inhalation Aerosol Powder Breath Activated;  Therapy: (Recorded:29Aug2014) to Recorded 4. Aspirin 325 MG Oral Tablet;  Therapy: (Recorded:29Aug2014) to Recorded 5. Crestor 40 MG Oral Tablet;  Therapy: (Recorded:29Aug2014) to Recorded 6. Losartan Potassium 50 MG Oral Tablet;  Therapy: (Recorded:29Aug2014) to Recorded  Allergies Medication  1. Levaquin TABS  Family History Problems   1. Family history of Death In The Family Mother   age 50 lung cancer 2. Family history of Diabetes Mellitus (V18.0) 3. Family history of Family Health Status Number Of Children   1 son 1 daughter 4. Family history of Lung Cancer (V16.1) : Mother 5. Family history of Lung Cancer (V16.1)  Social History Problems  1. Alcohol Use 2. Caffeine Use   3 per day 3. Marital History - Currently Married 4. Never A Smoker 5. Occupation:   Pharmacist, hospital  Review of Systems .   Vitals Vital Signs [Data Includes: Last 1 Day]  Recorded: 01Sep2015 02:55PM  Blood Pressure: 118 / 79 Temperature: 97.8 F Heart Rate: 83  Results/Data Urinalysis today reveals no evidence of infection, inflammation, or hematuria. He does have significant amount of glucosuria.  Scrotal ultrasounds today was performed to evaluate the abnormality in the patient's right testicle. The right testicle measures 4.65 cm in length with a with a 3.08 cm. There is a 2.251.261.88 cm area in the upper pole of the right testicle which has a thick calcified area with significant posterior shadowing and a signal void prevents full characterization of the lesion. He does however appear to be partly intratesticular. The epididymis is normal appearing. The patient's left testicle measures 4.47 cm in length with a width of 2.78 cm. There are no masses or abnormalities within the left testicle. There are no hydroceles or varicoceles bilaterally. The epididymides are normal.    The patient's KUB was performed today to follow-up on a history of nephrolithiasis. The renal shadows are apparent bilaterally. On the patient's left there is a 7 mm stone which is in the mid pole  region. There are no other identifiable calcifications within either renal shadow bilaterally. Further, the expected trajectory of each ureter along the transverse processes reveals no evidence of calcification or stone. The bowel gas pattern is normal. The patient does appear  to have some constipation. The bony structures are grossly normal.     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 Given its calcified appearance would suggest this to be a chronic/old process. However, the lesion needs to be better characterized. Further, tumor markers for testicular neoplasm have been ordered to ensure that this is not a obvious testicular cancer. I have also ordered a repeat renal ultrasound at [facility] within the next few days. I will touch base with the patient in regards to the final read as well as his tumor markers. We did discuss radical orchiectomy versus intraoperative biopsy versus follow-up ultrasound. We will continue the discussion once we get more information.  #2 we will follow the patient's nephrolithiasis with serial x-ray with a repeat ultrasound in 6 months.   Plan  Epididymal mass  1. ALPHA-FETOPROTIEN (TUMOR MARKER); Status:In Progress - Specimen/Data  Collected;   Done: [id number] 2. BETA HCG TUMOR MARKER; Status:In Progress - Specimen/Data Collected;   Done:  [id number] 3. LDH; Status:In Progress - Specimen/Data Collected;   Done: [id number] 4. VENIPUNCTURE; Status:Complete;   Done: [id number]  UA With REFLEX; [Do Not Release]; Status:Complete;  Done: [id number] 12:00AM Due:03Sep2015; Marked Important;Ordered; Today;  EPP:IRJJOA Maintenance; Ordered CZ:YSAYTKZ, Marland Kitchen;   Discussion/Summary Scrotal ultrasound tumor markers. Follow-up for discussion.

## 2013-12-14 NOTE — Interval H&P Note (Signed)
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  We have spoken about the risk of malignancy and have decided to proceed with radical orchiectomy.  We will plan to place a testicular prosthesis at the same time. Risk/benefits have been discussed.  Patient has agreed to proceed.  12/14/2013 7:16 AM  [name] [name]  has presented today for surgery, with the diagnosis of RIGHT TESTICULAR MASS  The various methods of treatment have been discussed with the patient and family. After consideration of risks, benefits and other options for treatment, the patient has consented to  Procedure(s): RIGHT RADICAL ORCHIECTOMY (Right) as a surgical intervention .  The patient's history has been reviewed, patient examined, no change in status, stable for surgery.  I have reviewed the patient's chart and labs.  Questions were answered to the patient's satisfaction.     Louis Meckel W

## 2013-12-14 NOTE — Op Note (Signed)
Preoperative diagnosis:  1. Right testicular mass  Postoperative diagnosis: 1. Right testicular mass  Procedure(s): 1. Right radical orchiectomy 2. Right testicular prosthesis implant  Surgeon: Dr. Louis Meckel  Resident: Dr. Amaryllis Dyke  Anesthesia: General  Complications: None  EBL: 25cc  IVF: See anesthesia  UOP: Not recorded  Specimens: Right testicle and spermatic cord  Disposition of specimens: Pathology  Intraoperative findings: Palpable right testicular mass  Indication: Right testicular mass  Description of procedure: Patient correctly identified in the pre-operative holding area and his site was marked and verified. General anesthesia was induced and his operative site was clipped, prepped, and draped in the usual sterile fashion. Pre-procedure time out was called. A 3cm oblique incision just above the inguinal ligament was made and dissection was carried down through the subcutaneous tissues down to Scarpa's fascia, which was incised. The spermatic cord was identified after sharply incising the external oblique aponeurosis with a 15 blade. The ileoinguinal nerve was pushed down off the underside of the aponeurosis and it was opened sharply with Metzenbaum scissors. The spermatic cord was isolated and a vessel loop was placed around it creating a tourniquet. The cord was freed proximally to the internal ring and distally. The testicle was pushed into through the external ring. The gubernaculum was incised taking care to avoid making a button hole in the skin. The remaining cremasters were incised and the nerve was dropped. The cord was divided separating the vas and vessels and doubly clamped. The two pedicles were tied off separately with 2-0 silk. The vessels were additionally stick tied with a silk suture. Hemostasis was obtained and the inguinal canal and scrotum were irrigated thoroughly with saline and antibiotic solution. A 2-0 prolene was placed inferiorly in  the scrotum through the dartos. 16cc of sterile saline was injected into the prosthesis. The prolene was used to secure the prosthesis and dropped into the scrotum. 3-0 Vicryl was used to close the external oblique aponeurosis and Scarpa's fascia in a running fashion. The incision was irrigated and 3-0 monacryl was used to close the skin. Local anesthesia was administered at incision.  Dr. Louis Meckel was present and scrubbed for the entire procedure.

## 2013-12-14 NOTE — Discharge Instructions (Signed)
°  Post Anesthesia Home Care Instructions ° °Activity: °Get plenty of rest for the remainder of the day. A responsible adult should stay with you for 24 hours following the procedure.  °For the next 24 hours, DO NOT: °-Drive a car °-Operate machinery °-Drink alcoholic beverages °-Take any medication unless instructed by your physician °-Make any legal decisions or sign important papers. ° °Meals: °Start with liquid foods such as gelatin or soup. Progress to regular foods as tolerated. Avoid greasy, spicy, heavy foods. If nausea and/or vomiting occur, drink only clear liquids until the nausea and/or vomiting subsides. Call your physician if vomiting continues. ° °Special Instructions/Symptoms: °Your throat may feel dry or sore from the anesthesia or the breathing tube placed in your throat during surgery. If this causes discomfort, gargle with warm salt water. The discomfort should disappear within 24 hours. ° ° ° HOME CARE INSTRUCTIONS FOR SCROTAL PROCEDURES ° °Wound Care & Hygiene: °You may apply an ice bag to the scrotum for the first 24 hours.  This may help decrease swelling and soreness.  You may have a dressing held in place by an athletic supporter.  You may remove the dressing in 24 hours and shower in 48 hours.  Continue to use the athletic supporter or tight briefs for at least a week. °Activity: °Rest today - not necessarily flat bed rest.  Just take it easy.  You should not do strenuous activities until your follow-up visit with your doctor.  You may resume light activity in 48 hours. ° °Return to Work: ° °Your doctor will advise you of this depending on the type of work you do ° °Diet: °Drink liquids or eat a light diet this evening.  You may resume a regular diet tomorrow. ° °General Expectations: °You may have a small amount of bleeding.  The scrotum may be swollen or bruised for about a week. ° °Call your Doctor if these occur: ° -persistent or heavy bleeding ° -temperature of 101 degrees or  more ° -severe pain, not relieved by your pain medication ° °Return to Doctor's Office:  °Call to set up and appointment. ° °Patient Signature:  __________________________________________________ ° °Nurse's Signature:  __________________________________________________ ° °

## 2013-12-16 NOTE — Anesthesia Postprocedure Evaluation (Signed)
Anesthesia Post Note  Patient: [name] [name]  Procedure(s) Performed: Procedure(s) (LRB): RIGHT RADICAL ORCHIECTOMY WITH PLACEMENT OF TESTICULAR PROTHESIS (Right)  Anesthesia type: General  Patient location: PACU  Post pain: Pain level controlled  Post assessment: Post-op Vital signs reviewed  Last Vitals: BP 133/68  Pulse 80  Temp(Src) 36.3 C (Oral)  Resp 16  Ht 5\' 10"  (1.778 m)  Wt 141 lb 8 oz (64.184 kg)  BMI 20.30 kg/m2  SpO2 97%  Post vital signs: Reviewed  Level of consciousness: sedated  Complications: No apparent anesthesia complications

## 2013-12-18 ENCOUNTER — Encounter (HOSPITAL_BASED_OUTPATIENT_CLINIC_OR_DEPARTMENT_OTHER): Payer: Self-pay | Admitting: Urology

## 2013-12-31 ENCOUNTER — Telehealth: Payer: Self-pay | Admitting: Critical Care Medicine

## 2014-01-03 NOTE — Telephone Encounter (Signed)
Pt aware refill is ready at pharmacy. Pt aware to contact dr turner for future refills.  Nothing further needed at this time.

## 2014-01-03 NOTE — Telephone Encounter (Signed)
Received losartan rx from pharm. Pt is seeing Dr. Fransico Him with Cardiology.   Losartan rx sent to pharm x 30 tabs with no refills with instructions to please defer future refills to Dr. Radford Pax. PW aware and is in agreement. I have lmomtcb for pt so he is aware I have sent losartan rx with request to defer future rxs to Dr. Radford Pax to ensure proper HTN management.

## 2014-01-18 ENCOUNTER — Other Ambulatory Visit: Payer: BC Managed Care – PPO

## 2014-01-30 ENCOUNTER — Other Ambulatory Visit: Payer: Self-pay | Admitting: Critical Care Medicine

## 2014-01-30 NOTE — Telephone Encounter (Signed)
Received losartan rx request from [facility]. Rx last sent in Sept with request to defer future refills to pt's cardiologist, Dr. Tressia Miners Turner's office. Spoke with Sh[facility] with [facility].  They will send rx request to Dr. Theodosia Blender office and will call our office back for assistance if needed.

## 2014-02-01 ENCOUNTER — Other Ambulatory Visit (INDEPENDENT_AMBULATORY_CARE_PROVIDER_SITE_OTHER): Payer: BC Managed Care – PPO | Admitting: *Deleted

## 2014-02-01 ENCOUNTER — Other Ambulatory Visit: Payer: Self-pay

## 2014-02-01 DIAGNOSIS — E785 Hyperlipidemia, unspecified: Secondary | ICD-10-CM

## 2014-02-01 LAB — HEPATIC FUNCTION PANEL
ALT: 22 U/L (ref 0–53)
AST: 24 U/L (ref 0–37)
Albumin: 3.9 g/dL (ref 3.5–5.2)
Alkaline Phosphatase: 57 U/L (ref 39–117)
Bilirubin, Direct: 0.1 mg/dL (ref 0.0–0.3)
Total Bilirubin: 0.9 mg/dL (ref 0.2–1.2)
Total Protein: 7 g/dL (ref 6.0–8.3)

## 2014-02-01 MED ORDER — LOSARTAN POTASSIUM 50 MG PO TABS: ORAL_TABLET | ORAL | Status: DC

## 2014-02-04 LAB — NMR LIPOPROFILE WITH LIPIDS
Cholesterol, Total: 126 mg/dL (ref 100–199)
HDL Particle Number: 39.5 umol/L (ref >=30.5)
HDL Size: 10 nm (ref >=9.2)
HDL-C: 68 mg/dL (ref >39)
LDL (calc): 47 mg/dL (ref 0–99)
LDL Particle Number: 538 nmol/L (ref <1000)
LDL Size: 20 nm (ref >=20.8)
LP-IR Score: <25 (ref <=45)
Large HDL-P: 13.7 umol/L (ref >=4.8)
Large VLDL-P: 1.4 nmol/L (ref <=2.7)
Small LDL Particle Number: 308 nmol/L (ref <=527)
Triglycerides: 55 mg/dL (ref 0–149)
VLDL Size: 44.5 nm (ref <=46.6)

## 2014-02-07 ENCOUNTER — Telehealth: Payer: Self-pay | Admitting: Cardiology

## 2014-02-07 NOTE — Telephone Encounter (Signed)
Patient informed of lab results and verbal understanding expressed.  No new orders or medical changes.

## 2014-02-07 NOTE — Telephone Encounter (Signed)
New message ° °Pt returned call for results//sr  °

## 2014-03-05 ENCOUNTER — Encounter: Payer: Self-pay | Admitting: Cardiology

## 2014-03-05 ENCOUNTER — Ambulatory Visit (INDEPENDENT_AMBULATORY_CARE_PROVIDER_SITE_OTHER): Payer: BC Managed Care – PPO | Admitting: *Deleted

## 2014-03-05 ENCOUNTER — Ambulatory Visit (INDEPENDENT_AMBULATORY_CARE_PROVIDER_SITE_OTHER): Payer: BC Managed Care – PPO | Admitting: Cardiology

## 2014-03-05 VITALS — BP 116/62 | HR 72 | Ht 70.0 in | Wt 144.2 lb

## 2014-03-05 DIAGNOSIS — I2510 Atherosclerotic heart disease of native coronary artery without angina pectoris: Secondary | ICD-10-CM | POA: Diagnosis not present

## 2014-03-05 DIAGNOSIS — I10 Essential (primary) hypertension: Secondary | ICD-10-CM | POA: Diagnosis not present

## 2014-03-05 DIAGNOSIS — I319 Disease of pericardium, unspecified: Secondary | ICD-10-CM | POA: Diagnosis not present

## 2014-03-05 DIAGNOSIS — Z8679 Personal history of other diseases of the circulatory system: Secondary | ICD-10-CM | POA: Insufficient documentation

## 2014-03-05 DIAGNOSIS — E785 Hyperlipidemia, unspecified: Secondary | ICD-10-CM

## 2014-03-05 DIAGNOSIS — Z23 Encounter for immunization: Secondary | ICD-10-CM

## 2014-03-05 DIAGNOSIS — I2583 Coronary atherosclerosis due to lipid rich plaque: Principal | ICD-10-CM

## 2014-03-05 NOTE — Progress Notes (Signed)
[facility], [place], [place]  [address] Phone: [phone] Fax:  [phone]  Date:  03/05/2014   ID:  [name] [name], DOB 13-Apr-1960, MRN [id number]  PCP:  Gennette Pac, MD  Cardiologist:  Fransico Him, MD    History of Present Illness: [name] [name] is a 53 y.o. male with a history of nonobstructive ASCAD, HTN, dyslipidemia and chest pain with no ischemia on stress test and small pericardial effusion on echo which was felt to be the etiology of the CP. He was started on Motrin 600mg  TID for 10 days with resolution of his chest pain. He now presents back for followup. He is doing well. He denies any chest pain, SOB, DOE, dizziness, palpitations or syncope. He complains of LE edema occasionally.   Wt Readings from Last 3 Encounters:  03/05/14 144 lb 3.2 oz (65.409 kg)  12/14/13 141 lb 8 oz (64.184 kg)  08/21/13 144 lb (65.318 kg)     Past Medical History  Diagnosis Date  . Dyslipidemia   . Anemia   . Nephrolithiasis   . Coronary artery disease 07/17/2004    nonobstructive ASCAD with 40-50% LAD  . Hyperlipidemia   . Hypertension   . Type I diabetes mellitus     uses insulin pump; followed by Dr. Louanna Raw Tannenmbaum  . Asthma     uses asmanex and albuterol    Current Outpatient Prescriptions  Medication Sig Dispense Refill  . albuterol (PROVENTIL HFA;VENTOLIN HFA) 108 (90 BASE) MCG/ACT inhaler Inhale 2 puffs into the lungs every 4 (four) hours as needed.      Marland Kitchen amLODipine (NORVASC) 10 MG tablet Take 1 tablet (10 mg total) by mouth daily. 30 tablet 6  . aspirin 81 MG tablet Take 81 mg by mouth daily.    . benzonatate (TESSALON) 100 MG capsule Use 1-2 every 4- 6  Hours as needed for cough 90 capsule 4  . diphenhydrAMINE (BENADRYL) 25 mg capsule Take 25 mg by mouth every 6 (six) hours as needed for itching.    . docusate sodium (COLACE) 100 MG capsule Take 1 capsule (100 mg total) by mouth 2 (two) times daily as needed (take to keep stool  soft.). 60 capsule 0  . glucagon (GLUCAGON EMERGENCY) 1 MG injection Inject 1 mg into the vein as directed.      . insulin aspart (NOVOLOG) 100 UNIT/ML injection Inject 50 Units into the skin daily. Via insulin pump Basal rate  0.85 units/hr Bolus w meals = 5 units per pt    . nitroGLYCERIN (NITROSTAT) 0.4 MG SL tablet Place 1 tablet (0.4 mg total) under the tongue every 5 (five) minutes as needed for chest pain. 90 tablet 3  . ONE TOUCH ULTRA TEST test strip as directed.    . rosuvastatin (CRESTOR) 40 MG tablet Take 0.5 tablets (20 mg total) by mouth daily. 90 tablet 0  . mometasone (ASMANEX) 220 MCG/INH inhaler Inhale 2 puffs into the lungs daily. 1 Inhaler    No current facility-administered medications for this visit.    Allergies:    Allergies  Allergen Reactions  . Cardizem [Diltiazem Hcl]     Flushing and chest pressure  . Carvedilol Hives    Hives and Itching  . Levofloxacin     stomach upset    Social History:  The patient  reports that he has never smoked. He has never used smokeless tobacco. He reports that he drinks about 1.8 oz of alcohol per week. He  reports that he does not use illicit drugs.   Family History:  The patient's family history includes Cancer in his maternal aunt and maternal uncle; Coronary artery disease in his father; Diabetes in his brother, father, and paternal grandmother; Hypertension in his father; Hypothyroidism in his sister; Lung cancer in his mother.   ROS:  Please see the history of present illness.      All other systems reviewed and negative.   PHYSICAL EXAM: VS:  BP 116/62 mmHg  Pulse 72  Ht 5\' 10"  (1.778 m)  Wt 144 lb 3.2 oz (65.409 kg)  BMI 20.69 kg/m2  SpO2 97% Well nourished, well developed, in no acute distress HEENT: normal Neck: no JVD Cardiac:  normal S1, S2; RRR; no murmur Lungs:  clear to auscultation bilaterally, no wheezing, rhonchi or rales Abd: soft, nontender, no hepatomegaly Ext: no edema Skin: warm and  dry Neuro:  CNs 2-12 intact, no focal abnormalities noted  ASSESSMENT AND PLAN:  1. Pericarditis - resolved - he got a URI shortly after the CP so it may have been a viral URI 2. Atypical CP secondary to #1 - resolved 3. HTN - well controlled - continue amlodipine/Losartan  4. Nonobstructive ASCAD  - continue ASA  5. Dyslipidemia - lipids at goal - continue crestor 6.  Flu shot was administered today  Followup with me in 6 months      Signed, Fransico Him, MD [facility] 03/05/2014 3:50 PM

## 2014-03-05 NOTE — Patient Instructions (Signed)
Your physician recommends that you continue on your current medications as directed. Please refer to the Current Medication list given to you today.  Your physician wants you to follow-up in: 6 months with Dr Turner You will receive a reminder letter in the mail two months in advance. If you don't receive a letter, please call our office to schedule the follow-up appointment.  

## 2014-03-08 ENCOUNTER — Other Ambulatory Visit: Payer: Self-pay

## 2014-03-08 ENCOUNTER — Encounter: Payer: Self-pay | Admitting: Cardiology

## 2014-03-11 NOTE — Telephone Encounter (Signed)
Close Encounter 

## 2014-03-12 ENCOUNTER — Other Ambulatory Visit: Payer: Self-pay | Admitting: Cardiology

## 2014-04-12 DIAGNOSIS — I319 Disease of pericardium, unspecified: Secondary | ICD-10-CM

## 2014-04-12 HISTORY — DX: Disease of pericardium, unspecified: I31.9

## 2014-05-02 ENCOUNTER — Encounter: Payer: BLUE CROSS/BLUE SHIELD | Attending: Internal Medicine | Admitting: *Deleted

## 2014-05-02 VITALS — Ht 70.0 in | Wt 146.8 lb

## 2014-05-02 DIAGNOSIS — Z713 Dietary counseling and surveillance: Secondary | ICD-10-CM | POA: Insufficient documentation

## 2014-05-02 DIAGNOSIS — E108 Type 1 diabetes mellitus with unspecified complications: Secondary | ICD-10-CM | POA: Insufficient documentation

## 2014-05-02 DIAGNOSIS — Z794 Long term (current) use of insulin: Secondary | ICD-10-CM | POA: Diagnosis not present

## 2014-05-10 ENCOUNTER — Encounter: Payer: Self-pay | Admitting: *Deleted

## 2014-05-10 NOTE — Progress Notes (Signed)
Diabetes Self-Management Education  Visit Type:  Initial  Appt. Start Time: 1500 Appt. End Time: 1430  05/10/2014  Mr. [name] [name], identified by name and date of birth, is a 54 y.o. male with a diagnosis of Diabetes: Type 1 (History of DM for about 30 years. Currently on Medtronic insulin pump. Interested in more detailed Carb Counting and Advanced Freatures on pump.).  Other people present during visit:  Patient   ASSESSMENT  Height 5\' 10"  (1.778 m), weight 146 lb 12.8 oz (66.588 kg). Body mass index is 21.06 kg/(m^2).  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[employer])  Psychosocial:     Patient Belief/Attitude about Diabetes: Motivated to manage diabetes Self-care barriers: None Self-management support: Doctor's office Other persons present: Patient Patient Concerns: Nutrition/Meal planning, Medication Special Needs: None Preferred Learning Style: No preference indicated Learning Readiness: Ready  Complications:   Last HgB A1C per patient/outside source: 9.3 % How often do you check your blood sugar?: 3-4 times/day Have you had a dilated eye exam in the past 12 months?: Yes Have you had a dental exam in the past 12 months?: No  Diet Intake:     Exercise:  Exercise: Light (walking / raking leaves) (much more active in summer with basketball and biking) Light Exercise amount of time (min / week): 90  Individualized Plan for Diabetes Self-Management Training:   Learning Objective:  Patient will have a greater understanding of diabetes  self-management.  Patient education plan per assessed needs and concerns is to attend individual sessions for     Education Topics Reviewed with Patient Today:    Role of diet in the treatment of diabetes and the relationship between the three main macronutrients and blood glucose level, Food label reading, portion sizes and measuring food., Carbohydrate counting, Information on hints to eating out and maintain blood glucose control., Other (comment) (Use of Dual Wave for hgher fat meals) Other (comment) (Use of Temp Basal for increased activity)         Helped patient identify a support system for diabetes management (Offered DM 1 Support Group)      PATIENTS GOALS/Plan (Developed by the patient):  Nutrition: Adjust meds/carbs with exercise as discussed Reducing Risk: examine blood glucose patterns (Discussed that he can upload his pump as needed so that his MD or I can help assess and offer pump setting changes.)  Plan:   Plan: Continue to use Bolus Wizard for Meal and Correction insulin  Consider use of Dual Wave for higher fat meals Consider use of Temp Basal for increased activity Consider use of Carb Counting by Food Group when food label or grams info not available   Expected Outcomes:  Demonstrated interest in learning. Expect positive outcomes  Education material provided: Meal plan card, Support group flyer and Carbohydrate counting sheet  If problems or questions, patient to contact team via:  Phone and Email  Future DSME appointment: 3-4 months

## 2014-05-10 NOTE — Patient Instructions (Signed)
Plan: Continue to use Bolus Wizard for Meal and Correction insulin  Consider use of Dual Wave for higher fat meals Consider use of Temp Basal for increased activity Consider use of Carb Counting by Food Group when food label or grams info not available

## 2014-05-15 ENCOUNTER — Other Ambulatory Visit: Payer: Self-pay | Admitting: Internal Medicine

## 2014-06-06 ENCOUNTER — Encounter: Payer: Self-pay | Admitting: Cardiology

## 2014-07-11 ENCOUNTER — Ambulatory Visit (INDEPENDENT_AMBULATORY_CARE_PROVIDER_SITE_OTHER): Payer: BLUE CROSS/BLUE SHIELD | Admitting: Critical Care Medicine

## 2014-07-11 ENCOUNTER — Encounter: Payer: Self-pay | Admitting: Critical Care Medicine

## 2014-07-11 VITALS — BP 128/81 | HR 85 | Temp 97.6°F | Ht 70.0 in | Wt 151.0 lb

## 2014-07-11 DIAGNOSIS — J4540 Moderate persistent asthma, uncomplicated: Secondary | ICD-10-CM

## 2014-07-11 MED ORDER — MOMETASONE FUROATE 220 MCG/INH IN AEPB: 2.0000 | INHALATION_SPRAY | Freq: Every day | RESPIRATORY_TRACT | Status: DC

## 2014-07-11 NOTE — Patient Instructions (Signed)
Refill and restart asmanex two puff daily Return 4 months

## 2014-07-11 NOTE — Progress Notes (Signed)
Subjective:    Patient ID: [name] [name], male    DOB: 05-04-1960, 54 y.o.   MRN: [id number]  HPI 54 y.o. [facility]     07/11/2014 Chief Complaint  Patient presents with  . Follow-up    Asthma: wheezing some. no other concerns.  Needs Asthmanex refill.   Pt had flare one month ago and Rx pcp.  CXR ok PFR ok   No mucus  No fcs Pt denies any significant sore throat, nasal congestion or excess secretions, fever, chills, sweats, unintended weight loss, pleurtic or exertional chest pain, orthopnea PND, or leg swelling Pt denies any increase in rescue therapy over baseline, denies waking up needing it or having any early am or nocturnal exacerbations of coughing/wheezing/or dyspnea. Pt also denies any obvious fluctuation in symptoms with  weather or environmental change or other alleviating or aggravating factors   PUL ASTHMA HISTORY 07/11/2014 03/13/2013 10/05/2012 12/09/2011  Symptoms >2 days/week 0-2 days/week 0-2 days/week 0-2 days/week  Nighttime awakenings 0-2/month 0-2/month 0-2/month 0-2/month  Interference with activity Minor limitations No limitations No limitations No limitations  SABA use > 2 days/wk--not > 1 x/day 0-2 days/wk 0-2 days/wk 0-2 days/wk  Exacerbations requiring oral steroids 0-1 / year 0-1 / year 0-1 / year 0-1 / year       Past Medical History  Diagnosis Date  . Dyslipidemia   . Anemia   . Nephrolithiasis   . Coronary artery disease 07/17/2004    nonobstructive ASCAD with 40-50% LAD  . Hyperlipidemia   . Hypertension   . Type I diabetes mellitus     uses insulin pump; followed by Dr. Louanna Raw Tannenmbaum  . Asthma     uses asmanex and albuterol     Family History  Problem Relation Age of Onset  . Coronary artery disease Father   . Diabetes Father   . Hypertension Father   . Lung cancer Mother   . Diabetes Paternal Grandmother   . Diabetes Brother   . Hypothyroidism Sister   . Cancer Maternal Aunt   . Cancer Maternal Uncle      History    Social History  . Marital Status: Married    Spouse Name: N/A  . Number of Children: N/A  . Years of Education: N/A   Occupational History  . Teacher    Social History Main Topics  . Smoking status: Never Smoker   . Smokeless tobacco: Never Used  . Alcohol Use: 1.8 oz/week    3 Cans of beer per week     Comment: 3 x weekly  . Drug Use: No  . Sexual Activity: Not on file   Other Topics Concern  . Not on file   Social History Narrative     Allergies  Allergen Reactions  . Cardizem [Diltiazem Hcl]     Flushing and chest pressure  . Carvedilol Hives    Hives and Itching  . Levofloxacin     stomach upset     Outpatient Prescriptions Prior to Visit  Medication Sig Dispense Refill  . albuterol (PROVENTIL HFA;VENTOLIN HFA) 108 (90 BASE) MCG/ACT inhaler Inhale 2 puffs into the lungs every 4 (four) hours as needed.      Marland Kitchen amLODipine (NORVASC) 10 MG tablet TAKE 1 TABLET (10 MG TOTAL) BY MOUTH DAILY. 30 tablet 6  . aspirin 81 MG tablet Take 81 mg by mouth daily.    . CRESTOR 40 MG tablet TAKE 0.5 TABLETS (20 MG TOTAL) BY MOUTH DAILY. 90 tablet 1  .  glucagon (GLUCAGON EMERGENCY) 1 MG injection Inject 1 mg into the vein as directed.      . insulin aspart (NOVOLOG) 100 UNIT/ML injection Inject 50 Units into the skin daily. Via insulin pump Basal rate  0.85 units/hr Bolus w meals = 5 units per pt    . losartan (COZAAR) 50 MG tablet Take 50 mg by mouth daily.    . nitroGLYCERIN (NITROSTAT) 0.4 MG SL tablet Place 1 tablet (0.4 mg total) under the tongue every 5 (five) minutes as needed for chest pain. 90 tablet 3  . ONE TOUCH ULTRA TEST test strip as directed.    . benzonatate (TESSALON) 100 MG capsule Use 1-2 every 4- 6  Hours as needed for cough 90 capsule 4  . diphenhydrAMINE (BENADRYL) 25 mg capsule Take 25 mg by mouth every 6 (six) hours as needed for itching.    . docusate sodium (COLACE) 100 MG capsule Take 1 capsule (100 mg total) by mouth 2 (two) times daily as needed  (take to keep stool soft.). 60 capsule 0  . mometasone (ASMANEX) 220 MCG/INH inhaler Inhale 2 puffs into the lungs daily. 1 Inhaler    No facility-administered medications prior to visit.      Review of Systems  Constitutional: Positive for fatigue. Negative for diaphoresis, activity change and unexpected weight change.  HENT: Negative for congestion, dental problem, ear discharge, facial swelling, hearing loss, mouth sores, nosebleeds, sinus pressure, tinnitus and voice change.   Eyes: Negative for photophobia, discharge, itching and visual disturbance.  Respiratory: Negative for apnea, choking, chest tightness and stridor.   Cardiovascular: Negative for palpitations.  Gastrointestinal: Negative for nausea, constipation, blood in stool and abdominal distention.  Genitourinary: Negative for dysuria, urgency, frequency, hematuria, flank pain, decreased urine volume and difficulty urinating.  Musculoskeletal: Negative for back pain, joint swelling, arthralgias, gait problem and neck stiffness.  Skin: Negative for color change and pallor.  Neurological: Negative for dizziness, tremors, seizures, syncope, speech difficulty, weakness, light-headedness and numbness.  Hematological: Negative for adenopathy. Does not bruise/bleed easily.  Psychiatric/Behavioral: Negative for confusion, sleep disturbance and agitation. The patient is not nervous/anxious.        Objective:   Physical Exam  Filed Vitals:   07/11/14 1144  BP: 128/81  Pulse: 85  Temp: 97.6 F (36.4 C)  TempSrc: Oral  Height: 5\' 10"  (1.778 m)  Weight: 151 lb (68.493 kg)  SpO2: 97%    Gen: Pleasant, well-nourished, in no distress,  normal affect  ENT: No lesions,  mouth clear,  oropharynx clear, no postnasal drip  Neck: No JVD, no TMG, no carotid bruits  Lungs: No use of accessory muscles, no dullness to percussion, clear, no wheezes  Cardiovascular: RRR, heart sounds normal, no murmur or gallops, no peripheral  edema  Abdomen: soft and NT, no HSM,  BS normal  Musculoskeletal: No deformities, no cyanosis or clubbing  Neuro: alert, non focal  Skin: Warm, no lesions or rashes   No results found.       Assessment & Plan:   Moderate persistent asthma with allergic rhinitis Mod pers asthma stable Plan No change in inhaled or maintenance medications. Return in  6 months     Updated Medication List Outpatient Encounter Prescriptions as of 07/11/2014  Medication Sig  . albuterol (PROVENTIL HFA;VENTOLIN HFA) 108 (90 BASE) MCG/ACT inhaler Inhale 2 puffs into the lungs every 4 (four) hours as needed.    Marland Kitchen amLODipine (NORVASC) 10 MG tablet TAKE 1 TABLET (10 MG TOTAL) BY MOUTH  DAILY.  . aspirin 81 MG tablet Take 81 mg by mouth daily.  . CRESTOR 40 MG tablet TAKE 0.5 TABLETS (20 MG TOTAL) BY MOUTH DAILY.  Marland Kitchen glucagon (GLUCAGON EMERGENCY) 1 MG injection Inject 1 mg into the vein as directed.    . insulin aspart (NOVOLOG) 100 UNIT/ML injection Inject 50 Units into the skin daily. Via insulin pump Basal rate  0.85 units/hr Bolus w meals = 5 units per pt  . losartan (COZAAR) 50 MG tablet Take 50 mg by mouth daily.  . mometasone (ASMANEX) 220 MCG/INH inhaler Inhale 2 puffs into the lungs daily.  . nitroGLYCERIN (NITROSTAT) 0.4 MG SL tablet Place 1 tablet (0.4 mg total) under the tongue every 5 (five) minutes as needed for chest pain.  . ONE TOUCH ULTRA TEST test strip as directed.  . [DISCONTINUED] benzonatate (TESSALON) 100 MG capsule Use 1-2 every 4- 6  Hours as needed for cough  . [DISCONTINUED] diphenhydrAMINE (BENADRYL) 25 mg capsule Take 25 mg by mouth every 6 (six) hours as needed for itching.  . [DISCONTINUED] docusate sodium (COLACE) 100 MG capsule Take 1 capsule (100 mg total) by mouth 2 (two) times daily as needed (take to keep stool soft.).  . [DISCONTINUED] mometasone (ASMANEX) 220 MCG/INH inhaler Inhale 2 puffs into the lungs daily.

## 2014-07-12 NOTE — Assessment & Plan Note (Signed)
Mod pers asthma stable Plan No change in inhaled or maintenance medications. Return in  6 months

## 2014-07-18 ENCOUNTER — Other Ambulatory Visit (HOSPITAL_COMMUNITY): Payer: Self-pay | Admitting: Urology

## 2014-07-18 DIAGNOSIS — N281 Cyst of kidney, acquired: Secondary | ICD-10-CM

## 2014-08-01 ENCOUNTER — Encounter: Payer: BLUE CROSS/BLUE SHIELD | Attending: Internal Medicine | Admitting: *Deleted

## 2014-08-01 VITALS — Ht 70.0 in | Wt 151.5 lb

## 2014-08-01 DIAGNOSIS — Z794 Long term (current) use of insulin: Secondary | ICD-10-CM | POA: Diagnosis not present

## 2014-08-01 DIAGNOSIS — E109 Type 1 diabetes mellitus without complications: Secondary | ICD-10-CM | POA: Diagnosis present

## 2014-08-01 DIAGNOSIS — Z713 Dietary counseling and surveillance: Secondary | ICD-10-CM | POA: Diagnosis not present

## 2014-08-01 NOTE — Patient Instructions (Signed)
Plan: Continue to use Bolus Wizard for Meal and Correction insulin  Continue use of Dual Wave for higher fat meals Consider use of Temp Basal for increased activity Great Job!

## 2014-08-03 NOTE — Progress Notes (Signed)
  Pump Follow Up Progress Note  Orders not yet requested from MD regarding giving me permission to make insulin pump adjustments for the following patient.  Reviewed blood glucose logs on 08/01/14 via: [facility] and found the following:            Hypoglycemia Hyperglycemia Comments  Overnight Period:   YES BASAL  Pre-Meal:    Breakfast  YES BASAL   Lunch      Supper     Post-Meal: Breakfast      Lunch      Supper     Bedtime:   YES BASAL   Comments: Patient is testing twice a day before breakfast and bedtime, so not adequate data to determine if Carb Ratio and ISF are accurate for him. He is using the Bolus Wizard more often now, usually once a day but with majority of boluses being given manually still. He states he only had hypoglycemia about twice a month and he is symptomatic. He states he has extended the duration of the Dual Wave from 30 minutes to 2 hours with better results.  Patient states his A1c has improved from 9.3 to 7.5% and he has gained 5 pounds since last appointment with me from 146 to 151 pounds for current healthier BMI of 21.8  Pump Settings: Date: Current Date: 08/01/14  No changes made    Basal Rate: Carb Ratio Sensitivity  Basal Rate: Carb Ratio Sensitivity   MN: 0.900 MN: 6.5 80 MN: 0.900 MN: 6.5 80   6 A 0.925 10 A: 5.5  6 A 0.925 10 A: 5.5   2 P 0.950 5 P: 5.5  2 P 0.950 5 P: 5.5   6 P 0.975   6 P 0.975                                Plan: Patient aware that he can upload to [facility] as needed and notify me for review of reports. If he would like me to offer setting changes, we will request permission from Dr. Buddy Duty. Encouraged patient to continue using Bolus Wizard as often as possible and to increase frequency as tolerated. Informed patient of DM 1 Support Group and he would like to be added to the email list.   Follow up: PRN

## 2014-08-30 ENCOUNTER — Ambulatory Visit (HOSPITAL_COMMUNITY)
Admission: RE | Admit: 2014-08-30 | Discharge: 2014-08-30 | Disposition: A | Discharge status: Home / Self Care | Payer: BLUE CROSS/BLUE SHIELD | Source: Ambulatory Visit | Attending: Urology | Admitting: Urology

## 2014-08-30 DIAGNOSIS — N289 Disorder of kidney and ureter, unspecified: Secondary | ICD-10-CM | POA: Insufficient documentation

## 2014-08-30 DIAGNOSIS — N281 Cyst of kidney, acquired: Secondary | ICD-10-CM

## 2014-08-30 LAB — POCT I-STAT CREATININE: Creatinine, Ser: 1.1 mg/dL (ref 0.61–1.24)

## 2014-08-30 MED ORDER — GADOBENATE DIMEGLUMINE 529 MG/ML IV SOLN
15.0000 mL | Freq: Once | INTRAVENOUS | Status: AC | PRN
  Administered 2014-08-30: 15 mL via INTRAVENOUS

## 2014-09-02 ENCOUNTER — Ambulatory Visit (INDEPENDENT_AMBULATORY_CARE_PROVIDER_SITE_OTHER): Payer: BLUE CROSS/BLUE SHIELD | Admitting: Cardiology

## 2014-09-02 ENCOUNTER — Encounter: Payer: Self-pay | Admitting: Cardiology

## 2014-09-02 VITALS — BP 120/70 | HR 71 | Ht 70.0 in | Wt 152.8 lb

## 2014-09-02 DIAGNOSIS — I10 Essential (primary) hypertension: Secondary | ICD-10-CM

## 2014-09-02 DIAGNOSIS — I319 Disease of pericardium, unspecified: Secondary | ICD-10-CM

## 2014-09-02 DIAGNOSIS — E785 Hyperlipidemia, unspecified: Secondary | ICD-10-CM

## 2014-09-02 DIAGNOSIS — R002 Palpitations: Secondary | ICD-10-CM

## 2014-09-02 DIAGNOSIS — I2510 Atherosclerotic heart disease of native coronary artery without angina pectoris: Secondary | ICD-10-CM | POA: Diagnosis not present

## 2014-09-02 DIAGNOSIS — I2583 Coronary atherosclerosis due to lipid rich plaque: Principal | ICD-10-CM

## 2014-09-02 LAB — HEPATIC FUNCTION PANEL
ALT: 19 U/L (ref 0–53)
AST: 22 U/L (ref 0–37)
Albumin: 4.7 g/dL (ref 3.5–5.2)
Alkaline Phosphatase: 63 U/L (ref 39–117)
Bilirubin, Direct: 0.1 mg/dL (ref 0.0–0.3)
Total Bilirubin: 0.6 mg/dL (ref 0.2–1.2)
Total Protein: 7 g/dL (ref 6.0–8.3)

## 2014-09-02 LAB — LIPID PANEL
Cholesterol: 135 mg/dL (ref 0–200)
HDL: 61.3 mg/dL (ref >39.00)
LDL Cholesterol: 63 mg/dL (ref 0–99)
NonHDL: 73.7
Total CHOL/HDL Ratio: 2
Triglycerides: 53 mg/dL (ref 0.0–149.0)
VLDL: 10.6 mg/dL (ref 0.0–40.0)

## 2014-09-02 NOTE — Progress Notes (Signed)
Cardiology Office Note   Date:  09/02/2014   ID:  [name] [name], DOB 16-Sep-1960, MRN [id number]  PCP:  Gennette Pac, MD  Cardiologist:   Sueanne Margarita, MD   Chief Complaint  Patient presents with  . Follow-up    CAD      History of Present Illness:  [name] [name] is a 54 y.o. male with a history of nonobstructive ASCAD, HTN, dyslipidemia and chest pain with no ischemia on stress test and small pericardial effusion on echo which was felt to be the etiology of the CP. He was started on Motrin 600mg  TID for 10 days with resolution of his chest pain. He now presents back for followup. He is doing well. He denies any chest pain, SOB, DOE, LE edema, dizziness or syncope. He is complaining of feeling fluttering in his chest at night only when laying on his right side.  He does not notice it during the day.   Past Medical History  Diagnosis Date  . Dyslipidemia   . Anemia   . Nephrolithiasis   . Coronary artery disease 07/17/2004    nonobstructive ASCAD with 40-50% LAD  . Hyperlipidemia   . Hypertension   . Type I diabetes mellitus     uses insulin pump; followed by Dr. Louanna Raw Tannenmbaum  . Asthma     uses asmanex and albuterol    Past Surgical History  Procedure Laterality Date  . Lithotripsy    . Back surgery  2013  . Cardiac catheterization    . 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 albuterol (PROVENTIL HFA;VENTOLIN HFA) 108 (90 BASE) MCG/ACT inhaler Inhale 2 puffs into the lungs every 4 (four) hours as needed.      Marland Kitchen amLODipine (NORVASC) 10 MG tablet TAKE 1 TABLET (10 MG TOTAL) BY MOUTH DAILY. 30 tablet 6  . aspirin 81 MG tablet Take 81 mg by mouth daily.    . CRESTOR 40 MG tablet TAKE 0.5  TABLETS (20 MG TOTAL) BY MOUTH DAILY. 90 tablet 1  . glucagon (GLUCAGON EMERGENCY) 1 MG injection Inject 1 mg into the vein as directed.      . insulin aspart (NOVOLOG) 100 UNIT/ML injection Inject 50 Units into the skin daily. Via insulin pump Basal rate  0.85 units/hr Bolus w meals = 5 units per pt    . losartan (COZAAR) 50 MG tablet Take 50 mg by mouth daily.    . mometasone (ASMANEX) 220 MCG/INH inhaler Inhale 2 puffs into the lungs daily. 1 Inhaler 6  . nitroGLYCERIN (NITROSTAT) 0.4 MG SL tablet Place 1 tablet (0.4 mg total) under the tongue every 5 (five) minutes as needed for chest pain. 90 tablet 3  . ONE TOUCH ULTRA TEST test strip as directed.     No current facility-administered medications for this visit.    Allergies:   Cardizem; Carvedilol; and Levofloxacin    Social History:  The patient  reports that he has never smoked. He has never used smokeless tobacco. He reports that he drinks about 1.8 oz of alcohol per week. He reports that he does not use illicit drugs.   Family History:  The patient's family history includes Cancer in his maternal aunt and maternal uncle; Coronary artery disease in his  father; Diabetes in his brother, father, and paternal grandmother; Hypertension in his father; Hypothyroidism in his sister; Lung cancer in his mother.    ROS:  Please see the history of present illness.   Otherwise, review of systems are positive for none.   All other systems are reviewed and negative.    PHYSICAL EXAM: VS:  Ht 5\' 10"  (1.778 m)  Wt 152 lb 12.8 oz (69.31 kg)  BMI 21.92 kg/m2 , BMI Body mass index is 21.92 kg/(m^2). GEN: Well nourished, well developed, in no acute distress HEENT: normal Neck: no JVD, carotid bruits, or masses Cardiac: RRR; no murmurs, rubs, or gallops,no edema  Respiratory:  clear to auscultation bilaterally, normal work of breathing GI: soft, nontender, nondistended, + BS MS: no deformity or atrophy Skin: warm and dry, no rash Neuro:   Strength and sensation are intact Psych: euthymic mood, full affect   EKG:  EKG is ordered today. The ekg ordered today demonstrates NSR with no ST changes, rSR' in V1 and V2   Recent Labs: 12/14/2013: Hemoglobin 15.0; Potassium 4.1; Sodium 137 02/01/2014: ALT 22 08/30/2014: Creatinine 1.10    Lipid Panel    Component Value Date/Time   CHOL 126 02/01/2014 0823   TRIG 55 02/01/2014 0823   HDL 68 02/01/2014 0823   LDLCALC 47 02/01/2014 0823      Wt Readings from Last 3 Encounters:  09/02/14 152 lb 12.8 oz (69.31 kg)  08/01/14 151 lb 8 oz (68.72 kg)  07/11/14 151 lb (68.493 kg)     ASSESSMENT AND PLAN:  1. Pericarditis - resolved  2. Atypical CP secondary to #1 - resolved 3. HTN - well controlled - continue amlodipine/Losartan  4. Nonobstructive ASCAD  - continue ASA  5. Dyslipidemia - lipids at goal - continue crestor and recheck FLP and ALT 6.  Palpitations - I will get an event monitor to rule out PAF    Current medicines are reviewed at length with the patient today.  The patient does not have concerns regarding medicines.  The following changes have been made:  no change  Labs/ tests ordered today include: FLP and ALT  No orders of the defined types were placed in this encounter.     Disposition:   FU with me in 1 year  Signed, Sueanne Margarita, MD  09/02/2014 8:10 AM    [address], [place], [place]  [address] Phone: [phone]; Fax: [phone]

## 2014-09-02 NOTE — Patient Instructions (Signed)
Medication Instructions:  Your physician recommends that you continue on your current medications as directed. Please refer to the Current Medication list given to you today.   Labwork: TODAY: LFTs, lipids   Testing/Procedures: Your physician has recommended that you wear an event monitor. Event monitors are medical devices that record the heart's electrical activity. Doctors most often [place] these monitors to diagnose arrhythmias. Arrhythmias are problems with the speed or rhythm of the heartbeat. The monitor is a small, portable device. You can wear one while you do your normal daily activities. This is usually used to diagnose what is causing palpitations/syncope (passing out).  Follow-Up: Your physician wants you to follow-up in: 1 year with Dr. Radford Pax. You will receive a reminder letter in the mail two months in advance. If you don't receive a letter, please call our office to schedule the follow-up appointment.   Any Other Special Instructions Will Be Listed Below (If Applicable).

## 2014-09-04 ENCOUNTER — Other Ambulatory Visit: Payer: Self-pay | Admitting: *Deleted

## 2014-09-04 MED ORDER — LOSARTAN POTASSIUM 50 MG PO TABS: 50.0000 mg | ORAL_TABLET | Freq: Every day | ORAL | Status: DC

## 2014-09-05 ENCOUNTER — Ambulatory Visit (INDEPENDENT_AMBULATORY_CARE_PROVIDER_SITE_OTHER): Payer: BLUE CROSS/BLUE SHIELD

## 2014-09-05 DIAGNOSIS — R002 Palpitations: Secondary | ICD-10-CM | POA: Diagnosis not present

## 2014-09-10 ENCOUNTER — Encounter: Payer: Self-pay | Admitting: Cardiology

## 2014-09-26 ENCOUNTER — Encounter: Payer: Self-pay | Admitting: Cardiology

## 2014-10-07 ENCOUNTER — Other Ambulatory Visit: Payer: Self-pay

## 2014-10-11 ENCOUNTER — Other Ambulatory Visit: Payer: Self-pay | Admitting: *Deleted

## 2014-10-11 MED ORDER — NEBIVOLOL HCL 2.5 MG PO TABS: 2.5000 mg | ORAL_TABLET | Freq: Every day | ORAL | Status: DC

## 2014-10-22 ENCOUNTER — Encounter: Payer: Self-pay | Admitting: Cardiology

## 2014-11-29 ENCOUNTER — Encounter: Payer: Self-pay | Admitting: Cardiology

## 2014-12-11 ENCOUNTER — Other Ambulatory Visit: Payer: Self-pay | Admitting: Internal Medicine

## 2014-12-19 ENCOUNTER — Encounter: Payer: Self-pay | Admitting: Cardiology

## 2015-03-12 ENCOUNTER — Other Ambulatory Visit: Payer: Self-pay

## 2015-03-12 MED ORDER — MOMETASONE FUROATE 220 MCG/INH IN AEPB: 2.0000 | INHALATION_SPRAY | Freq: Every day | RESPIRATORY_TRACT | Status: DC

## 2015-04-30 ENCOUNTER — Encounter: Payer: BLUE CROSS/BLUE SHIELD | Attending: Internal Medicine | Admitting: *Deleted

## 2015-04-30 DIAGNOSIS — Z713 Dietary counseling and surveillance: Secondary | ICD-10-CM | POA: Insufficient documentation

## 2015-04-30 DIAGNOSIS — E109 Type 1 diabetes mellitus without complications: Secondary | ICD-10-CM | POA: Insufficient documentation

## 2015-04-30 NOTE — Patient Instructions (Signed)
Plan: We discussed rotation of your infusion sites in wider areas of your abdomen, your upper buttocks and the back of your arms Consider inside exercise options such as Arm Chair Exercise Videos We have emailed Roxanne with [employer] to contact you about ordering the 630G and then the 670G Consider alternating infusion sets between [place] and [place] again.

## 2015-05-02 NOTE — Progress Notes (Signed)
Insulin Pump Evaluation Visit:  Date: 04/30/15   Appt start time: 1400 end time:  1430.  Assessment:  This patient has DM 1 and their primary concerns today: to learn more about Medtronic 630G and 670G insulin pumps as well as review site rotation questions.   MEDICATIONS: See list. Patient uses Novolog in his Revel insulin pump  Patient states they are currently testing BG 4+ times per day  Patient states knowledge of Carb Counting is 10 on a scale of 1-10  Intervention:    Showed patient the following pumps: Medtronic 630G and information on 670G  Discussed current Continuous Glucose Monitoring options   Discussed site rotation for insulin pump as well as potential sites for CGM  Monitoring/Evaluation:    Patient does want to continue with pursuit of Medtronic 630G  insulin pump.  Patient asked me to contact local [employer] so they can start the process of obtaining the pump. Contact information provided to the patient.  Patient instructed to go to that pump web-site to complete any learning module on insulin pump prior to next visit  Once pump is shipped, patient to call this office to set up training.

## 2015-05-18 ENCOUNTER — Other Ambulatory Visit: Payer: Self-pay | Admitting: Adult Health

## 2015-06-10 ENCOUNTER — Encounter: Payer: BLUE CROSS/BLUE SHIELD | Attending: Internal Medicine | Admitting: *Deleted

## 2015-06-10 DIAGNOSIS — E109 Type 1 diabetes mellitus without complications: Secondary | ICD-10-CM | POA: Insufficient documentation

## 2015-06-10 DIAGNOSIS — Z713 Dietary counseling and surveillance: Secondary | ICD-10-CM | POA: Insufficient documentation

## 2015-06-13 NOTE — Progress Notes (Signed)
Pump Upgrade Training: Appt start time: 1400 end time: 1500.   Assessment: Primary concerns today: Patient here for upgrade to Medtronic 630G  insulin pump.    Medications: see list. Insulin used in pump is Novolog  Intervention:  Pump settings transferred directly from current pump to new pump by patient New meter ID added to pump for communicating Reviewed new features of pump with patient Patient  is signed up on [facility] Web Site Patient expressed understanding of info taught and is wearing pump by end of visit.  Follow Up  Patient to see MD for insulin dose adjustments    CGM Training:  Appt start time: 1500 end time:1630.  Assessment:  Primary concerns today: Patient here for initiation of Medtronic Continuous Glucose Monitoring also  Intervention:   Understanding Glucose Sensing Programming Sensor Information  High Glucose: NA  Low Glucose 80 mg/dl  Other settings to be added at follow up visit Starting [employer]  Entering BG, Calibration Technique and Graphs with Education officer, community and Alarms  Follow Up Patient to see MD for insulin dose adjustments and to schedule visit with me for CGM follow up within 1 week(s).

## 2015-06-17 ENCOUNTER — Encounter: Payer: BLUE CROSS/BLUE SHIELD | Attending: Internal Medicine | Admitting: *Deleted

## 2015-06-17 DIAGNOSIS — Z713 Dietary counseling and surveillance: Secondary | ICD-10-CM | POA: Insufficient documentation

## 2015-06-17 DIAGNOSIS — E108 Type 1 diabetes mellitus with unspecified complications: Secondary | ICD-10-CM

## 2015-06-17 DIAGNOSIS — E109 Type 1 diabetes mellitus without complications: Secondary | ICD-10-CM | POA: Insufficient documentation

## 2015-06-17 NOTE — Progress Notes (Signed)
CGM Training follow up visit:  Appt start time: 1130 end time:1200.  Assessment:  Primary concerns today: Patient here for follow up visit having started the Medtronic Continuous Glucose Monitor one week ago. He states he is very happy with CGM so far. He states he did have a Low Glucose Alarm the 1st night and decided to lower the Low Glucose Alert from 80 to 70 mg/dl. He states he has not had any alarms since the first night.   Intervention:  I observed him change out the sensor successfully and charged the transmitter as directed. Sensor communication was resumed before he left, waiting for the 2 hour Warm Up period.   Follow Up Patient to see MD for insulin dose adjustments and to schedule visit with me for CGM follow up PRN

## 2015-06-19 ENCOUNTER — Other Ambulatory Visit: Payer: Self-pay | Admitting: Adult Health

## 2015-06-25 ENCOUNTER — Other Ambulatory Visit: Payer: Self-pay | Admitting: Cardiology

## 2015-07-09 ENCOUNTER — Telehealth: Payer: Self-pay | Admitting: Adult Health

## 2015-07-09 NOTE — Telephone Encounter (Signed)
OK to refill

## 2015-07-09 NOTE — Telephone Encounter (Signed)
Former PW patient : Patient has upcoming visit with Dr Elsworth Soho 08/2015 Needing refill of Asmanex inhaler.  Please advise if you are okay with filling this prior to patient being seen. Thanks.

## 2015-07-12 ENCOUNTER — Other Ambulatory Visit: Payer: Self-pay | Admitting: Cardiology

## 2015-07-15 ENCOUNTER — Other Ambulatory Visit: Payer: Self-pay | Admitting: Cardiology

## 2015-07-15 NOTE — Telephone Encounter (Signed)
amLODipine (NORVASC) 10 MG tablet  Medication   Date: 07/14/2015  Department: [address] [facility]  Ordering/Authorizing: Sueanne Margarita, MD      Order Providers    Prescribing Provider Encounter Provider   Sueanne Margarita, MD Sueanne Margarita, MD    Medication Detail      Disp Refills Start End     amLODipine (NORVASC) 10 MG tablet 30 tablet 6 07/14/2015     Sig: TAKE 1 TABLET BY MOUTH EVERY DAY    E-Prescribing Status: Receipt confirmed by pharmacy (07/14/2015 11:47 AM EDT)     Pharmacy    [id number] - [place], [place]

## 2015-07-16 ENCOUNTER — Other Ambulatory Visit: Payer: Self-pay | Admitting: Cardiology

## 2015-07-18 ENCOUNTER — Other Ambulatory Visit: Payer: Self-pay | Admitting: Urology

## 2015-07-18 DIAGNOSIS — N281 Cyst of kidney, acquired: Secondary | ICD-10-CM

## 2015-08-22 ENCOUNTER — Ambulatory Visit (HOSPITAL_COMMUNITY)
Admission: RE | Admit: 2015-08-22 | Discharge: 2015-08-22 | Disposition: A | Discharge status: Home / Self Care | Payer: BLUE CROSS/BLUE SHIELD | Source: Ambulatory Visit | Attending: Urology | Admitting: Urology

## 2015-08-22 DIAGNOSIS — N281 Cyst of kidney, acquired: Secondary | ICD-10-CM | POA: Diagnosis present

## 2015-08-22 DIAGNOSIS — K8689 Other specified diseases of pancreas: Secondary | ICD-10-CM | POA: Diagnosis not present

## 2015-08-22 DIAGNOSIS — N2889 Other specified disorders of kidney and ureter: Secondary | ICD-10-CM | POA: Insufficient documentation

## 2015-08-22 MED ORDER — GADOBENATE DIMEGLUMINE 529 MG/ML IV SOLN
15.0000 mL | Freq: Once | INTRAVENOUS | Status: AC | PRN
Start: 2015-08-22 — End: 2015-08-22
  Administered 2015-08-22: 15 mL via INTRAVENOUS

## 2015-09-03 ENCOUNTER — Encounter: Payer: Self-pay | Admitting: Cardiology

## 2015-09-03 ENCOUNTER — Other Ambulatory Visit: Payer: Self-pay | Admitting: Pulmonary Disease

## 2015-09-03 ENCOUNTER — Other Ambulatory Visit: Payer: Self-pay | Admitting: Cardiology

## 2015-09-03 ENCOUNTER — Ambulatory Visit (INDEPENDENT_AMBULATORY_CARE_PROVIDER_SITE_OTHER): Payer: BLUE CROSS/BLUE SHIELD | Admitting: Cardiology

## 2015-09-03 VITALS — BP 126/62 | HR 68 | Ht 70.0 in | Wt 159.4 lb

## 2015-09-03 DIAGNOSIS — E785 Hyperlipidemia, unspecified: Secondary | ICD-10-CM

## 2015-09-03 DIAGNOSIS — I319 Disease of pericardium, unspecified: Secondary | ICD-10-CM

## 2015-09-03 DIAGNOSIS — I2510 Atherosclerotic heart disease of native coronary artery without angina pectoris: Secondary | ICD-10-CM

## 2015-09-03 DIAGNOSIS — I2583 Coronary atherosclerosis due to lipid rich plaque: Principal | ICD-10-CM

## 2015-09-03 DIAGNOSIS — I10 Essential (primary) hypertension: Secondary | ICD-10-CM

## 2015-09-03 NOTE — Patient Instructions (Signed)
Medication Instructions:  Your physician recommends that you continue on your current medications as directed. Please refer to the Current Medication list given to you today.   Labwork: Your physician recommends that you return for FASTING lab work in 2 weeks: BMET, LFTs, lipids  Testing/Procedures: None  Follow-Up: Your physician wants you to follow-up in: 12 months with Dr. Radford Pax. You will receive a reminder letter in the mail two months in advance. If you don't receive a letter, please call our office to schedule the follow-up appointment.   Any Other Special Instructions Will Be Listed Below (If Applicable).     If you need a refill on your cardiac medications before your next appointment, please call your pharmacy.

## 2015-09-03 NOTE — Progress Notes (Signed)
Cardiology Office Note    Date:  09/03/2015   ID:  [name] [name], DOB [birth year], MRN [id number]  PCP:  [name] Pac, MD  Cardiologist:  [name] Him, MD   Chief Complaint  Patient presents with  . Coronary Artery Disease  . Hypertension  . Hyperlipidemia    History of Present Illness:  [name] [name] is a 55 y.o. male with a history of nonobstructive ASCAD, HTN, dyslipidemia and chest pain with no ischemia on stress test and small pericardial effusion on echo which was felt to be the etiology of the CP. He was started on Motrin 600mg  TID for 10 days with resolution of his chest pain. He  presents back for followup. He is doing well. He denies any chest pain, SOB, DOE,  dizziness or syncope. He has some mild LE edema at the end of the day.     Past Medical History  Diagnosis Date  . Dyslipidemia   . Anemia   . Nephrolithiasis   . Coronary artery disease 07/17/2004    nonobstructive ASCAD with 40-50% LAD  . Hyperlipidemia   . Hypertension   . Type I diabetes mellitus (HCC)     uses insulin pump; followed by Dr. Louanna Raw [name]  . Asthma     uses asmanex and albuterol    Past Surgical History  Procedure Laterality Date  . Lithotripsy    . Back surgery  2013  . Cardiac catheterization    . Orchiectomy Right 12/14/2013    Procedure: RIGHT RADICAL ORCHIECTOMY WITH PLACEMENT OF TESTICULAR PROTHESIS;  Surgeon: Ardis Hughs, MD;  Location: [facility];  Service: Urology;  Laterality: Right;    Current Medications: Outpatient Prescriptions Prior to Visit  Medication Sig Dispense Refill  . albuterol (PROVENTIL HFA;VENTOLIN HFA) 108 (90 BASE) MCG/ACT inhaler Inhale 2 puffs into the lungs every 4 (four) hours as needed.      Marland Kitchen amLODipine (NORVASC) 10 MG tablet TAKE 1 TABLET BY MOUTH EVERY DAY 30 tablet 11  . ASMANEX 60 METERED DOSES 220 MCG/INH inhaler INHALE 2 PUFFS INTO THE LUNGS DAILY. 1 Inhaler 0  . aspirin 81 MG tablet Take 81 mg  by mouth daily.    Marland Kitchen BYSTOLIC 2.5 MG tablet TAKE 1 TABLET BY MOUTH DAILY 30 tablet 2  . glucagon (GLUCAGON EMERGENCY) 1 MG injection Inject 1 mg into the vein as directed.      . insulin aspart (NOVOLOG) 100 UNIT/ML injection Inject 50 Units into the skin daily. Via insulin pump Basal rate  0.85 units/hr Bolus w meals = 5 units per pt    . losartan (COZAAR) 50 MG tablet Take 1 tablet (50 mg total) by mouth daily. 30 tablet 11  . nitroGLYCERIN (NITROSTAT) 0.4 MG SL tablet Place 1 tablet (0.4 mg total) under the tongue every 5 (five) minutes as needed for chest pain. 90 tablet 3  . ONE TOUCH ULTRA TEST test strip as directed.    . CRESTOR 40 MG tablet TAKE 0.5 TABLETS (20 MG TOTAL) BY MOUTH DAILY. 90 tablet 1   No facility-administered medications prior to visit.     Allergies:   Cardizem; Carvedilol; and Levofloxacin   Social History   Social History  . Marital Status: Married    Spouse Name: N/A  . Number of Children: N/A  . Years of Education: N/A   Occupational History  . Teacher    Social History Main Topics  . Smoking status: Never Smoker   . Smokeless  tobacco: Never Used  . Alcohol Use: 1.8 oz/week    3 Cans of beer per week     Comment: 3 x weekly  . Drug Use: No  . Sexual Activity: Not Asked   Other Topics Concern  . None   Social History Narrative     Family History:  The patient's family history includes Cancer in his maternal aunt and maternal uncle; Coronary artery disease in his father; Diabetes in his brother, father, and paternal grandmother; Hypertension in his father; Hypothyroidism in his sister; Lung cancer in his mother.   ROS:   Please see the history of present illness.    ROS All other systems reviewed and are negative.   PHYSICAL EXAM:   VS:  BP 126/62 mmHg  Pulse 68  Ht 5\' 10"  (1.778 m)  Wt 159 lb 6.4 oz (72.303 kg)  BMI 22.87 kg/m2   GEN: Well nourished, well developed, in no acute distress HEENT: normal Neck: no JVD, carotid bruits,  or masses Cardiac: RRR; no murmurs, rubs, or gallops,no edema.  Intact distal pulses bilaterally.  Respiratory:  clear to auscultation bilaterally, normal work of breathing GI: soft, nontender, nondistended, + BS MS: no deformity or atrophy Skin: warm and dry, no rash Neuro:  Alert and Oriented x 3, Strength and sensation are intact Psych: euthymic mood, full affect  Wt Readings from Last 3 Encounters:  09/03/15 159 lb 6.4 oz (72.303 kg)  09/02/14 152 lb 12.8 oz (69.31 kg)  08/01/14 151 lb 8 oz (68.72 kg)      Studies/Labs Reviewed:   EKG:  EKG is  ordered today.  The ekg ordered today demonstrates NSR at 68bpm with no ST changes  Recent Labs: No results found for requested labs within last 365 days.   Lipid Panel    Component Value Date/Time   CHOL 135 09/02/2014 0851   CHOL 126 02/01/2014 0823   TRIG 53.0 09/02/2014 0851   TRIG 55 02/01/2014 0823   HDL 61.30 09/02/2014 0851   HDL 68 02/01/2014 0823   CHOLHDL 2 09/02/2014 0851   VLDL 10.6 09/02/2014 0851   LDLCALC 63 09/02/2014 0851   LDLCALC 47 02/01/2014 0823    Additional studies/ records that were reviewed today include:  none    ASSESSMENT:    1. Coronary artery disease due to lipid rich plaque   2. Essential hypertension   3. Pericarditis   4. Dyslipidemia      PLAN:  In order of problems listed above:  1. ASCAD with no angina.  Continue ASA/statin/BB 2. HTN - BP well controlled.  Continue BB/amlodipine/ARB 3. Pericarditis - resolved 4. Dyslipidemia - LDL goal < 70.  Continue statin which he was started on by Dr. Buddy Duty about 4 weeks ago..  Check FLP and ALT in 2 weeks    Medication Adjustments/Labs and Tests Ordered: Current medicines are reviewed at length with the patient today.  Concerns regarding medicines are outlined above.  Medication changes, Labs and Tests ordered today are listed in the Patient Instructions below.  There are no Patient Instructions on file for this  visit.   Signed, [name] Him, MD  09/03/2015 10:14 AM    [name], [place], [place]  [address] Phone: [phone]; Fax: [phone]

## 2015-09-04 ENCOUNTER — Other Ambulatory Visit: Payer: Self-pay | Admitting: *Deleted

## 2015-09-04 ENCOUNTER — Ambulatory Visit (INDEPENDENT_AMBULATORY_CARE_PROVIDER_SITE_OTHER): Payer: BLUE CROSS/BLUE SHIELD | Admitting: Pulmonary Disease

## 2015-09-04 ENCOUNTER — Encounter: Payer: Self-pay | Admitting: Pulmonary Disease

## 2015-09-04 VITALS — BP 139/74 | HR 73 | Ht 70.0 in | Wt 159.0 lb

## 2015-09-04 DIAGNOSIS — J4540 Moderate persistent asthma, uncomplicated: Secondary | ICD-10-CM | POA: Diagnosis not present

## 2015-09-04 MED ORDER — ALBUTEROL SULFATE HFA 108 (90 BASE) MCG/ACT IN AERS: 2.0000 | INHALATION_SPRAY | RESPIRATORY_TRACT | Status: AC | PRN

## 2015-09-04 NOTE — Assessment & Plan Note (Signed)
Decrease Asmanex to 1 puff daily Refill on albuterol Call us as needed We discussed early antibiotic if you have yellow or green sputum with bronchitis

## 2015-09-04 NOTE — Progress Notes (Signed)
   Subjective:    Patient ID: [name] [name], male    DOB: 1960/09/24, 55 y.o.   MRN: [id number]  HPI   Chief Complaint  Patient presents with  . Follow-up    former [facility] patient, breathing is doing well.  no complaints.    For follow-up mild persistent asthma -He had a flareup 3 years ago and has been maintained on Asmanex 2 puffs since then He had an uneventful year other than a flareup in 06/2015 requiring antibiotics and a short course of steroids He feels that his flareups are related to attacks of bronchitis about once every winter No nocturnal symptoms-he has not needed albuterol for rescue much at all other than that one episode   Past Medical History  Diagnosis Date  . Dyslipidemia   . Anemia   . Nephrolithiasis   . Coronary artery disease 07/17/2004    nonobstructive ASCAD with 40-50% LAD  . Hyperlipidemia   . Hypertension   . Type I diabetes mellitus (HCC)     uses insulin pump; followed by Dr. Louanna Raw [place]enmbaum  . Asthma     uses asmanex and albuterol     Review of Systems Patient denies significant dyspnea,cough, hemoptysis,  chest pain, palpitations, pedal edema, orthopnea, paroxysmal nocturnal dyspnea, lightheadedness, nausea, vomiting, abdominal or  leg pains      Objective:   Physical Exam  Gen. Pleasant, well-nourished, in no distress ENT - no lesions, no post nasal drip Neck: No JVD, no thyromegaly, no carotid bruits Lungs: no use of accessory muscles, no dullness to percussion, clear without rales or rhonchi  Cardiovascular: Rhythm regular, heart sounds  normal, no murmurs or gallops, no peripheral edema Musculoskeletal: No deformities, no cyanosis or clubbing        Assessment & Plan:

## 2015-09-04 NOTE — Patient Instructions (Signed)
Decrease Asmanex to 1 puff daily Refill on albuterol Call us as needed We discussed early antibiotic if you have yellow or green sputum with bronchitis

## 2015-09-09 ENCOUNTER — Encounter: Payer: Self-pay | Admitting: Cardiology

## 2015-09-11 ENCOUNTER — Other Ambulatory Visit: Payer: Self-pay | Admitting: *Deleted

## 2015-09-11 DIAGNOSIS — I2510 Atherosclerotic heart disease of native coronary artery without angina pectoris: Secondary | ICD-10-CM

## 2015-09-11 DIAGNOSIS — R079 Chest pain, unspecified: Secondary | ICD-10-CM

## 2015-09-11 MED ORDER — NITROGLYCERIN 0.4 MG SL SUBL: 0.4000 mg | SUBLINGUAL_TABLET | SUBLINGUAL | Status: AC | PRN

## 2015-09-17 ENCOUNTER — Other Ambulatory Visit: Payer: Self-pay | Admitting: *Deleted

## 2015-09-17 MED ORDER — LOSARTAN POTASSIUM 50 MG PO TABS: 50.0000 mg | ORAL_TABLET | Freq: Every day | ORAL | Status: DC

## 2015-09-18 ENCOUNTER — Other Ambulatory Visit (INDEPENDENT_AMBULATORY_CARE_PROVIDER_SITE_OTHER): Payer: BLUE CROSS/BLUE SHIELD | Admitting: *Deleted

## 2015-09-18 DIAGNOSIS — I2583 Coronary atherosclerosis due to lipid rich plaque: Secondary | ICD-10-CM

## 2015-09-18 DIAGNOSIS — I2510 Atherosclerotic heart disease of native coronary artery without angina pectoris: Secondary | ICD-10-CM | POA: Diagnosis not present

## 2015-09-18 DIAGNOSIS — E785 Hyperlipidemia, unspecified: Secondary | ICD-10-CM | POA: Diagnosis not present

## 2015-09-18 DIAGNOSIS — I10 Essential (primary) hypertension: Secondary | ICD-10-CM

## 2015-09-18 LAB — HEPATIC FUNCTION PANEL
ALT: 21 U/L (ref 9–46)
AST: 20 U/L (ref 10–35)
Albumin: 4.4 g/dL (ref 3.6–5.1)
Alkaline Phosphatase: 53 U/L (ref 40–115)
Bilirubin, Direct: 0.1 mg/dL (ref <=0.2)
Indirect Bilirubin: 0.4 mg/dL (ref 0.2–1.2)
Total Bilirubin: 0.5 mg/dL (ref 0.2–1.2)
Total Protein: 6.7 g/dL (ref 6.1–8.1)

## 2015-09-18 LAB — LIPID PANEL
Cholesterol: 145 mg/dL (ref 125–200)
HDL: 73 mg/dL (ref >=40)
LDL Cholesterol: 64 mg/dL (ref <130)
Total CHOL/HDL Ratio: 2 Ratio (ref <=5.0)
Triglycerides: 41 mg/dL (ref <150)
VLDL: 8 mg/dL (ref <30)

## 2015-09-18 LAB — BASIC METABOLIC PANEL
BUN: 18 mg/dL (ref 7–25)
CO2: 23 mmol/L (ref 20–31)
Calcium: 9.3 mg/dL (ref 8.6–10.3)
Chloride: 104 mmol/L (ref 98–110)
Creat: 1.22 mg/dL (ref 0.70–1.33)
Glucose, Bld: 206 mg/dL — ABNORMAL HIGH (ref 65–99)
Potassium: 4.3 mmol/L (ref 3.5–5.3)
Sodium: 138 mmol/L (ref 135–146)

## 2015-09-18 NOTE — Addendum Note (Signed)
Addended by: Eulis Foster on: 09/18/2015 08:14 AM   Modules accepted: Orders

## 2015-10-01 ENCOUNTER — Other Ambulatory Visit: Payer: Self-pay | Admitting: Pulmonary Disease

## 2016-01-28 ENCOUNTER — Other Ambulatory Visit: Payer: Self-pay | Admitting: Urology

## 2016-01-28 ENCOUNTER — Other Ambulatory Visit: Payer: Self-pay | Admitting: Gastroenterology

## 2016-01-28 DIAGNOSIS — R1084 Generalized abdominal pain: Secondary | ICD-10-CM

## 2016-01-28 DIAGNOSIS — N281 Cyst of kidney, acquired: Secondary | ICD-10-CM

## 2016-02-10 ENCOUNTER — Ambulatory Visit
Admission: RE | Admit: 2016-02-10 | Discharge: 2016-02-10 | Disposition: A | Discharge status: Home / Self Care | Payer: BLUE CROSS/BLUE SHIELD | Source: Ambulatory Visit | Attending: Gastroenterology | Admitting: Gastroenterology

## 2016-02-10 DIAGNOSIS — R1084 Generalized abdominal pain: Secondary | ICD-10-CM

## 2016-03-02 ENCOUNTER — Ambulatory Visit (HOSPITAL_COMMUNITY)
Admission: RE | Admit: 2016-03-02 | Discharge: 2016-03-02 | Disposition: A | Discharge status: Home / Self Care | Payer: BLUE CROSS/BLUE SHIELD | Source: Ambulatory Visit | Attending: Urology | Admitting: Urology

## 2016-03-02 DIAGNOSIS — N2889 Other specified disorders of kidney and ureter: Secondary | ICD-10-CM | POA: Diagnosis not present

## 2016-03-02 DIAGNOSIS — N281 Cyst of kidney, acquired: Secondary | ICD-10-CM | POA: Diagnosis present

## 2016-03-02 LAB — POCT I-STAT CREATININE: Creatinine, Ser: 1 mg/dL (ref 0.61–1.24)

## 2016-03-02 MED ORDER — GADOBENATE DIMEGLUMINE 529 MG/ML IV SOLN
15.0000 mL | Freq: Once | INTRAVENOUS | Status: AC | PRN
  Administered 2016-03-02: 15 mL via INTRAVENOUS

## 2016-04-02 ENCOUNTER — Other Ambulatory Visit: Payer: Self-pay | Admitting: Cardiology

## 2016-05-26 ENCOUNTER — Other Ambulatory Visit: Payer: Self-pay | Admitting: Pulmonary Disease

## 2016-08-02 ENCOUNTER — Other Ambulatory Visit: Payer: Self-pay | Admitting: Pulmonary Disease

## 2016-08-16 ENCOUNTER — Other Ambulatory Visit: Payer: Self-pay | Admitting: *Deleted

## 2016-08-16 MED ORDER — NEBIVOLOL HCL 2.5 MG PO TABS: 2.5000 mg | ORAL_TABLET | Freq: Every day | ORAL | 0 refills | Status: DC

## 2016-08-18 ENCOUNTER — Encounter: Payer: Self-pay | Admitting: Cardiology

## 2016-08-18 ENCOUNTER — Other Ambulatory Visit: Payer: Self-pay | Admitting: Cardiology

## 2016-09-06 ENCOUNTER — Encounter: Payer: Self-pay | Admitting: Cardiology

## 2016-09-06 NOTE — Progress Notes (Deleted)
Cardiology Office Note    Date:  09/06/2016   ID:  [name] [name], DOB 11/06/1960, MRN [id number]  PCP:  Hulan Fess, MD  Cardiologist:  Fransico Him, MD   Chief Complaint  Patient presents with  . Coronary Artery Disease  . Hypertension  . Hyperlipidemia    History of Present Illness:  [name] [name] is a 56 y.o. 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 He  presents back for followup and  is doing well. He denies any chest pain, SOB, DOE, PND, orthopnea,  dizziness or syncope. He has some mild chronic LE edema at the end of the day which is stable.   Past Medical History:  Diagnosis Date  . Anemia   . Asthma    uses asmanex and albuterol  . Coronary artery disease 07/17/2004   nonobstructive ASCAD with 40-50% LAD  . Hyperlipidemia   . Hypertension   . Nephrolithiasis   . Pericarditis    resolved on followup echo  . Type I diabetes mellitus ([place])    uses insulin pump; followed by Dr. Sherrie George    Past Surgical History:  Procedure Laterality Date  . BACK SURGERY  2013  . CARDIAC CATHETERIZATION    . LITHOTRIPSY    . ORCHIECTOMY Right 12/14/2013   Procedure: RIGHT RADICAL ORCHIECTOMY WITH PLACEMENT OF TESTICULAR PROTHESIS;  Surgeon: Ardis Hughs, MD;  Location: [facility];  Service: Urology;  Laterality: Right;    Current Medications: No outpatient prescriptions have been marked as taking for the 09/07/16 encounter (Office Visit) with Sueanne Margarita, MD.    Allergies:   Cardizem [diltiazem hcl]; Carvedilol; and Levofloxacin   Social History   Social History  . Marital status: Married    Spouse name: N/A  . Number of children: N/A  . Years of education: N/A   Occupational History  . Teacher    Social History Main Topics  . Smoking status: Never Smoker  . Smokeless tobacco: Never Used  . Alcohol use 1.8 oz/week    3 Cans of beer per week     Comment: 3 x weekly  . Drug use: No  . Sexual activity: Not Asked   Other Topics Concern  . None   Social History Narrative  . None     Family History:  The patient's family history includes Cancer in his maternal aunt and maternal uncle; Coronary artery disease in his father; Diabetes in his brother, father, and paternal grandmother; Hypertension in his father; Hypothyroidism in his sister; Lung cancer in his mother.   ROS:   Please see the history of present illness.    ROS All other systems reviewed and are negative.  No flowsheet data found.     PHYSICAL EXAM:   VS:  There were no vitals taken for this visit.   GEN: Well nourished, well developed, in no acute distress  HEENT: normal  Neck: no JVD, carotid bruits, or masses Cardiac: RRR; no murmurs, rubs, or gallops,no edema.  Intact distal pulses bilaterally.  Respiratory:  clear to auscultation bilaterally, normal work of breathing GI: soft, nontender, nondistended, + BS MS: no deformity or atrophy  Skin: warm and dry, no rash Neuro:  Alert and Oriented x 3, Strength and sensation are intact Psych: euthymic mood, full affect  Wt Readings from Last 3 Encounters:  09/04/15 159 lb (72.1 kg)  09/03/15 159 lb 6.4 oz (72.3 kg)  09/02/14  152 lb 12.8 oz (69.3 kg)      Studies/Labs Reviewed:   EKG:  EKG is ordered today.  The ekg ordered today demonstrates   Recent Labs: 09/18/2015: ALT 21; BUN 18; Potassium 4.3; Sodium 138 03/02/2016: Creatinine, Ser 1.00   Lipid Panel    Component Value Date/Time   CHOL 145 09/18/2015 0814   CHOL 126 02/01/2014 0823   TRIG 41 09/18/2015 0814   TRIG 55 02/01/2014 0823   HDL 73 09/18/2015 0814   HDL 68 02/01/2014 0823   CHOLHDL 2.0 09/18/2015 0814   VLDL 8 09/18/2015 0814   LDLCALC 64 09/18/2015 0814   LDLCALC 47 02/01/2014 0823    Additional studies/ records that were reviewed today include:  none    ASSESSMENT:    1. Coronary artery disease involving native  coronary artery of native heart without angina pectoris   2. Pericarditis, unspecified chronicity, unspecified type   3. Essential hypertension   4. Dyslipidemia      PLAN:  In order of problems listed above:  1. Nonobstructive ASCAD with 40-50% LAD stenosis at the time of cath.  He has not had any further anginal symptoms.  He will continue on ASA, statin and BB. 2. Acute pericarditis resolved with treatment with NSAIDs.   3. HTN - BP is adequately controlled on exam today. He will continue on amlodipine 10mg  daily, Losartan 50mg  daily and Bystolic 2.5mg  daily.   4. Hyperlipidemia with LDL goal < 70.  He will continue on Atorvastatin 10mg  daily.  I will get an FLP and ALT.      Medication Adjustments/Labs and Tests Ordered: Current medicines are reviewed at length with the patient today.  Concerns regarding medicines are outlined above.  Medication changes, Labs and Tests ordered today are listed in the Patient Instructions below.  There are no Patient Instructions on file for this visit.   Signed, Fransico Him, MD  09/06/2016 7:25 PM    [facility] [address], [place], [place]  [address] Phone: [phone]; Fax: [phone]

## 2016-09-07 ENCOUNTER — Encounter: Payer: BLUE CROSS/BLUE SHIELD | Admitting: Cardiology

## 2016-09-08 NOTE — Progress Notes (Signed)
This encounter was created in error - please disregard.

## 2016-09-30 ENCOUNTER — Ambulatory Visit (INDEPENDENT_AMBULATORY_CARE_PROVIDER_SITE_OTHER): Payer: BC Managed Care – PPO | Admitting: Cardiology

## 2016-09-30 ENCOUNTER — Encounter: Payer: Self-pay | Admitting: Cardiology

## 2016-09-30 VITALS — BP 138/70 | HR 80 | Ht 70.0 in | Wt 164.0 lb

## 2016-09-30 DIAGNOSIS — I319 Disease of pericardium, unspecified: Secondary | ICD-10-CM

## 2016-09-30 DIAGNOSIS — I2510 Atherosclerotic heart disease of native coronary artery without angina pectoris: Secondary | ICD-10-CM | POA: Diagnosis not present

## 2016-09-30 DIAGNOSIS — I10 Essential (primary) hypertension: Secondary | ICD-10-CM

## 2016-09-30 DIAGNOSIS — E785 Hyperlipidemia, unspecified: Secondary | ICD-10-CM | POA: Diagnosis not present

## 2016-09-30 NOTE — Patient Instructions (Signed)
Medication Instructions:  Your physician recommends that you continue on your current medications as directed. Please refer to the Current Medication list given to you today.   Labwork: Your physician recommends that you return for FASTING lab work.  Testing/Procedures: None  Follow-Up: Your physician wants you to follow-up in: 1 year with Dr. Turner. You will receive a reminder letter in the mail two months in advance. If you don't receive a letter, please call our office to schedule the follow-up appointment.   Any Other Special Instructions Will Be Listed Below (If Applicable).     If you need a refill on your cardiac medications before your next appointment, please call your pharmacy.   

## 2016-09-30 NOTE — Progress Notes (Signed)
Cardiology Office Note    Date:  09/30/2016   ID:  [name] [name], DOB 07/31/1960, MRN [id number]  PCP:  Hulan Fess, MD  Cardiologist:  Fransico Him, MD   Chief Complaint  Patient presents with  . Coronary Artery Disease  . Hypertension  . Hyperlipidemia    History of Present Illness:  [name] [name] is a 56 y.o. male with a history of nonobstructive ASCAD with 40-50% LAD, HTN, h/o pericarditis with pericardial effusion (resolved with NSAIDS) and dyslipidemia.  He is here today for followup and is doing well.   He denies any chest pain or pressure, SOB, DOE, PND, orthopnea, dizziness or syncope. He has some mild LE edema at the end of the day which is stable. Occasionally he will have a skipped heart beat but not bothersome.     Past Medical History:  Diagnosis Date  . Anemia   . Asthma    uses asmanex and albuterol  . Coronary artery disease 07/17/2004   nonobstructive ASCAD with 40-50% LAD  . Hyperlipidemia   . Hypertension   . Nephrolithiasis   . Pericarditis    resolved on followup echo  . Type I diabetes mellitus ([place])    uses insulin pump; followed by Dr. Sherrie George    Past Surgical History:  Procedure Laterality Date  . BACK SURGERY  2013  . CARDIAC CATHETERIZATION    . LITHOTRIPSY    . ORCHIECTOMY Right 12/14/2013   Procedure: RIGHT RADICAL ORCHIECTOMY WITH PLACEMENT OF TESTICULAR PROTHESIS;  Surgeon: Ardis Hughs, MD;  Location: [facility];  Service: Urology;  Laterality: Right;    Current Medications: Current Meds  Medication Sig  . albuterol (PROVENTIL HFA;VENTOLIN HFA) 108 (90 Base) MCG/ACT inhaler Inhale 2 puffs into the lungs every 4 (four) hours as needed.  Marland Kitchen amLODipine (NORVASC) 10 MG tablet TAKE 1 TABLET BY MOUTH EVERY DAY  . ASMANEX 60 METERED DOSES 220 MCG/INH inhaler INHALE 2 PUFFS INTO THE LUNGS DAILY.  Marland Kitchen aspirin 81 MG tablet Take 81 mg by mouth daily.  Marland Kitchen atorvastatin (LIPITOR) 10 MG tablet Take 10  mg by mouth daily.  Marland Kitchen glucagon (GLUCAGON EMERGENCY) 1 MG injection Inject 1 mg into the vein as directed.    . insulin aspart (NOVOLOG) 100 UNIT/ML injection Inject 50 Units into the skin daily. Via insulin pump Basal rate  0.85 units/hr Bolus w meals = 5 units per pt  . losartan (COZAAR) 50 MG tablet TAKE 1 TABLET (50 MG TOTAL) BY MOUTH DAILY.  . nebivolol (BYSTOLIC) 2.5 MG tablet Take 1 tablet (2.5 mg total) by mouth daily. PT DUE APPT, CALL [phone] TO SCHEDULE, 1ST ATTEMPT  . nitroGLYCERIN (NITROSTAT) 0.4 MG SL tablet Place 1 tablet (0.4 mg total) under the tongue every 5 (five) minutes as needed for chest pain.  . ONE TOUCH ULTRA TEST test strip as directed.  Marland Kitchen RAPAFLO 8 MG CAPS capsule Take 8 mg by mouth daily.    Allergies:   Cardizem [diltiazem hcl]; Carvedilol; and Levofloxacin   Social History   Social History  . Marital status: Married    Spouse name: N/A  . Number of children: N/A  . Years of education: N/A   Occupational History  . Teacher    Social History Main Topics  . Smoking status: Never Smoker  . Smokeless tobacco: Never Used  . Alcohol use 1.8 oz/week    3 Cans of beer per week     Comment: 3 x weekly  .  Drug use: No  . Sexual activity: Not Asked   Other Topics Concern  . None   Social History Narrative  . None     Family History:  The patient's family history includes Cancer in his maternal aunt and maternal uncle; Coronary artery disease in his father; Diabetes in his brother, father, and paternal grandmother; Hypertension in his father; Hypothyroidism in his sister; Lung cancer in his mother.   ROS:   Please see the history of present illness.    Review of Systems  Respiratory: Positive for cough.   Musculoskeletal: Positive for back pain.   All other systems reviewed and are negative.  No flowsheet data found.     PHYSICAL EXAM:   VS:  BP 138/70   Pulse 80   Ht 5\' 10"  (1.778 m)   Wt 164 lb (74.4 kg)   SpO2 98%   BMI 23.53 kg/m     GEN: Well nourished, well developed, in no acute distress  HEENT: normal  Neck: no JVD, carotid bruits, or masses Cardiac: RRR; no murmurs, rubs, or gallops,no edema.  Intact distal pulses bilaterally.  Respiratory:  clear to auscultation bilaterally, normal work of breathing GI: soft, nontender, nondistended, + BS MS: no deformity or atrophy  Skin: warm and dry, no rash Neuro:  Alert and Oriented x 3, Strength and sensation are intact Psych: euthymic mood, full affect  Wt Readings from Last 3 Encounters:  09/30/16 164 lb (74.4 kg)  09/04/15 159 lb (72.1 kg)  09/03/15 159 lb 6.4 oz (72.3 kg)      Studies/Labs Reviewed:   EKG:  EKG is  ordered today.  The ekg ordered today demonstrates NSR at 75bpm with no ST changes  Recent Labs: 03/02/2016: Creatinine, Ser 1.00   Lipid Panel    Component Value Date/Time   CHOL 145 09/18/2015 0814   CHOL 126 02/01/2014 0823   TRIG 41 09/18/2015 0814   TRIG 55 02/01/2014 0823   HDL 73 09/18/2015 0814   HDL 68 02/01/2014 0823   CHOLHDL 2.0 09/18/2015 0814   VLDL 8 09/18/2015 0814   LDLCALC 64 09/18/2015 0814   LDLCALC 47 02/01/2014 0823    Additional studies/ records that were reviewed today include:  none    ASSESSMENT:    1. Coronary artery disease involving native coronary artery of native heart without angina pectoris   2. Essential hypertension   3. Pericarditis, unspecified chronicity, unspecified type   4. Dyslipidemia      PLAN:  In order of problems listed above:  1. Nonobstructive ASCAD with 40-50% LAD - he has not had any anginal symptoms. He will continue ASA 81mg  daily,  Bystolic 2.5mg  daily and atorvastatin 10mg  daily.  2. HTN - BP well controlled.  Continue Bystolic 2.5mg  daily, Losartan 50mg  daily and amlodipine 10mg  daily.   3. Pericarditis - resolved  4. Dyslipidemia - LDL goal < 70.  He will continue on atorvastatin 10mg  daily.  His last LDL was 64 a year ago. I will check an FLP and  ALT.    Medication Adjustments/Labs and Tests Ordered: Current medicines are reviewed at length with the patient today.  Concerns regarding medicines are outlined above.  Medication changes, Labs and Tests ordered today are listed in the Patient Instructions below.  There are no Patient Instructions on file for this visit.   Signed, Fransico Him, MD  09/30/2016 10:07 AM    [address], [place], [place]  [address] Phone: [phone]; Fax: [phone]

## 2016-10-04 ENCOUNTER — Other Ambulatory Visit: Payer: BC Managed Care – PPO | Admitting: *Deleted

## 2016-10-04 DIAGNOSIS — E785 Hyperlipidemia, unspecified: Secondary | ICD-10-CM

## 2016-10-04 DIAGNOSIS — I2510 Atherosclerotic heart disease of native coronary artery without angina pectoris: Secondary | ICD-10-CM

## 2016-10-04 LAB — BASIC METABOLIC PANEL
BUN/Creatinine Ratio: 14 (ref 9–20)
BUN: 17 mg/dL (ref 6–24)
CO2: 24 mmol/L (ref 20–29)
Calcium: 9.3 mg/dL (ref 8.7–10.2)
Chloride: 105 mmol/L (ref 96–106)
Creatinine, Ser: 1.19 mg/dL (ref 0.76–1.27)
GFR calc Af Amer: 80 mL/min/{1.73_m2} (ref >59)
GFR calc non Af Amer: 69 mL/min/{1.73_m2} (ref >59)
Glucose: 97 mg/dL (ref 65–99)
Potassium: 4.1 mmol/L (ref 3.5–5.2)
Sodium: 142 mmol/L (ref 134–144)

## 2016-10-04 LAB — HEPATIC FUNCTION PANEL
ALT: 21 IU/L (ref 0–44)
AST: 19 IU/L (ref 0–40)
Albumin: 4.6 g/dL (ref 3.5–5.5)
Alkaline Phosphatase: 66 IU/L (ref 39–117)
Bilirubin Total: 0.4 mg/dL (ref 0.0–1.2)
Bilirubin, Direct: 0.11 mg/dL (ref 0.00–0.40)
Total Protein: 6.4 g/dL (ref 6.0–8.5)

## 2016-10-04 LAB — LIPID PANEL
Chol/HDL Ratio: 2.7 ratio (ref 0.0–5.0)
Cholesterol, Total: 148 mg/dL (ref 100–199)
HDL: 55 mg/dL (ref >39)
LDL Calculated: 82 mg/dL (ref 0–99)
Triglycerides: 53 mg/dL (ref 0–149)
VLDL Cholesterol Cal: 11 mg/dL (ref 5–40)

## 2016-10-06 ENCOUNTER — Telehealth: Payer: Self-pay

## 2016-10-06 DIAGNOSIS — E785 Hyperlipidemia, unspecified: Secondary | ICD-10-CM

## 2016-10-06 DIAGNOSIS — Z79899 Other long term (current) drug therapy: Secondary | ICD-10-CM

## 2016-10-06 MED ORDER — ATORVASTATIN CALCIUM 20 MG PO TABS: 20.0000 mg | ORAL_TABLET | Freq: Every day | ORAL | 3 refills | Status: DC

## 2016-10-06 NOTE — Telephone Encounter (Signed)
-----   Message from Sueanne Margarita, MD sent at 10/06/2016  3:19 AM EDT ----- LDL not at goal - increase atorvastatin to 20mg  daily and repeat FLP and ALT in 6 week

## 2016-10-06 NOTE — Telephone Encounter (Signed)
Patient called back for his lab results. Per Dr. Radford Pax, LDL not at goal - increase atorvastatin to 20mg  daily and repeat FLP and ALT in 6 week. Patient verbalized understanding and will come in on November 17, 2016 for lab work. Sent increase of Atorvastatin to 20 mg daily to patient's pharmacy of choice.

## 2016-10-21 ENCOUNTER — Ambulatory Visit: Payer: BLUE CROSS/BLUE SHIELD | Admitting: Pulmonary Disease

## 2016-10-23 ENCOUNTER — Other Ambulatory Visit: Payer: Self-pay | Admitting: Cardiology

## 2016-11-14 ENCOUNTER — Other Ambulatory Visit: Payer: Self-pay | Admitting: Cardiology

## 2016-11-17 ENCOUNTER — Other Ambulatory Visit (INDEPENDENT_AMBULATORY_CARE_PROVIDER_SITE_OTHER): Payer: BC Managed Care – PPO

## 2016-11-17 DIAGNOSIS — Z79899 Other long term (current) drug therapy: Secondary | ICD-10-CM

## 2016-11-17 DIAGNOSIS — E785 Hyperlipidemia, unspecified: Secondary | ICD-10-CM

## 2016-11-17 LAB — LIPID PANEL
Chol/HDL Ratio: 2.8 ratio (ref 0.0–5.0)
Cholesterol, Total: 138 mg/dL (ref 100–199)
HDL: 49 mg/dL (ref >39)
LDL Calculated: 76 mg/dL (ref 0–99)
Triglycerides: 67 mg/dL (ref 0–149)
VLDL Cholesterol Cal: 13 mg/dL (ref 5–40)

## 2016-11-17 LAB — ALT: ALT: 22 IU/L (ref 0–44)

## 2016-11-19 ENCOUNTER — Telehealth: Payer: Self-pay

## 2016-11-19 DIAGNOSIS — E785 Hyperlipidemia, unspecified: Secondary | ICD-10-CM

## 2016-11-19 NOTE — Telephone Encounter (Signed)
Informed patient of results and verbal understanding expressed.  Patient wishes to decrease LDL by diet and exercise. He will repeat fasting lab work in 4 months. Recall placed. Patient agrees with treatment plan.

## 2016-11-19 NOTE — Telephone Encounter (Signed)
-----   Message from Sueanne Margarita, MD sent at 11/18/2016  7:02 AM EDT ----- LDL not at goal. Increase lipitor to 40mg  daily and repeat FLP and ALT in 6 weeks

## 2016-11-21 ENCOUNTER — Other Ambulatory Visit: Payer: Self-pay | Admitting: Cardiology

## 2016-12-19 ENCOUNTER — Other Ambulatory Visit: Payer: Self-pay | Admitting: Pulmonary Disease

## 2016-12-23 ENCOUNTER — Other Ambulatory Visit: Payer: Self-pay | Admitting: Urology

## 2016-12-23 DIAGNOSIS — D3000 Benign neoplasm of unspecified kidney: Secondary | ICD-10-CM

## 2017-03-01 ENCOUNTER — Ambulatory Visit (HOSPITAL_COMMUNITY)
Admission: RE | Admit: 2017-03-01 | Discharge: 2017-03-01 | Disposition: A | Discharge status: Home / Self Care | Payer: BC Managed Care – PPO | Source: Ambulatory Visit | Attending: Urology | Admitting: Urology

## 2017-03-01 DIAGNOSIS — D3000 Benign neoplasm of unspecified kidney: Secondary | ICD-10-CM | POA: Diagnosis not present

## 2017-03-01 DIAGNOSIS — N2889 Other specified disorders of kidney and ureter: Secondary | ICD-10-CM | POA: Insufficient documentation

## 2017-03-01 LAB — POCT I-STAT CREATININE: Creatinine, Ser: 1 mg/dL (ref 0.61–1.24)

## 2017-03-01 MED ORDER — GADOBENATE DIMEGLUMINE 529 MG/ML IV SOLN
20.0000 mL | Freq: Once | INTRAVENOUS | Status: AC | PRN
  Administered 2017-03-01: 15 mL via INTRAVENOUS

## 2017-03-07 MED ORDER — GADOBENATE DIMEGLUMINE 529 MG/ML IV SOLN
15.0000 mL | Freq: Once | INTRAVENOUS | Status: AC | PRN
  Administered 2017-03-07: 15 mL via INTRAVENOUS

## 2017-03-12 HISTORY — PX: OTHER SURGICAL HISTORY: SHX169

## 2017-08-11 ENCOUNTER — Other Ambulatory Visit: Payer: Self-pay | Admitting: Cardiology

## 2017-08-24 ENCOUNTER — Other Ambulatory Visit: Payer: Self-pay | Admitting: Pulmonary Disease

## 2017-08-31 ENCOUNTER — Other Ambulatory Visit: Payer: Self-pay | Admitting: *Deleted

## 2017-08-31 ENCOUNTER — Other Ambulatory Visit: Payer: Self-pay | Admitting: Cardiology

## 2017-09-07 ENCOUNTER — Other Ambulatory Visit: Payer: Self-pay | Admitting: Urology

## 2017-09-22 ENCOUNTER — Other Ambulatory Visit: Payer: Self-pay | Admitting: Pulmonary Disease

## 2017-10-03 ENCOUNTER — Other Ambulatory Visit: Payer: Self-pay | Admitting: Cardiology

## 2017-10-04 ENCOUNTER — Ambulatory Visit: Payer: BC Managed Care – PPO | Admitting: Cardiology

## 2017-10-04 ENCOUNTER — Encounter: Payer: Self-pay | Admitting: Cardiology

## 2017-10-04 VITALS — BP 126/72 | HR 73 | Ht 70.0 in | Wt 161.2 lb

## 2017-10-04 DIAGNOSIS — I10 Essential (primary) hypertension: Secondary | ICD-10-CM

## 2017-10-04 DIAGNOSIS — I2510 Atherosclerotic heart disease of native coronary artery without angina pectoris: Secondary | ICD-10-CM | POA: Diagnosis not present

## 2017-10-04 DIAGNOSIS — Z8679 Personal history of other diseases of the circulatory system: Secondary | ICD-10-CM | POA: Diagnosis not present

## 2017-10-04 DIAGNOSIS — E785 Hyperlipidemia, unspecified: Secondary | ICD-10-CM | POA: Diagnosis not present

## 2017-10-04 NOTE — Patient Instructions (Signed)
Medication Instructions:  Your physician recommends that you continue on your current medications as directed. Please refer to the Current Medication list given to you today.  If you need a refill on your cardiac medications, please contact your pharmacy first.  Labwork: None ordered   Testing/Procedures: None ordered   Follow-Up: Your physician wants you to follow-up in: 1 year with Dr. Turner. You will receive a reminder letter in the mail two months in advance. If you don't receive a letter, please call our office to schedule the follow-up appointment.  Any Other Special Instructions Will Be Listed Below (If Applicable).   Thank you for choosing [facility]    Rena Allisen Pidgeon, RN  [phone]  If you need a refill on your cardiac medications before your next appointment, please call your pharmacy.   

## 2017-10-04 NOTE — Progress Notes (Signed)
Cardiology Office Note:    Date:  10/04/2017   ID:  [name] [name], DOB [birth year], MRN [id number]  PCP:  Hulan Fess, MD  Cardiologist:  No primary care provider on file.    Referring MD: Hulan Fess, MD   Chief Complaint  Patient presents with  . Coronary Artery Disease  . Hypertension  . Hyperlipidemia    History of Present Illness:    [name] [name] is a 57 y.o. male with a hx of nonobstructive ASCAD with 40-50% LAD, HTN, h/o pericarditis with pericardial effusion (resolved with NSAIDS) and dyslipidemia.  He is here today for followup and is doing well.  He denies any chest pain or pressure, SOB, DOE, PND, orthopnea, LE edema, dizziness, palpitations or syncope. He is compliant with his meds and is tolerating meds with no SE.    Past Medical History:  Diagnosis Date  . Allergic reaction caused by a drug 03/07/2013  . Anemia   . Asthma    uses asmanex and albuterol  . Coronary artery disease 07/17/2004   nonobstructive ASCAD with 40-50% LAD  . Dyslipidemia   . Hyperlipidemia   . Hypertension   . Nephrolithiasis   . Pericarditis    resolved on followup echo  . Type I diabetes mellitus ([place])    uses insulin pump; followed by Dr. Sherrie George    Past Surgical History:  Procedure Laterality Date  . BACK SURGERY  2013  . CARDIAC CATHETERIZATION    . LITHOTRIPSY    . ORCHIECTOMY Right 12/14/2013   Procedure: RIGHT RADICAL ORCHIECTOMY WITH PLACEMENT OF TESTICULAR PROTHESIS;  Surgeon: Ardis Hughs, MD;  Location: [facility];  Service: Urology;  Laterality: Right;    Current Medications: Current Meds  Medication Sig  . albuterol (PROVENTIL HFA;VENTOLIN HFA) 108 (90 Base) MCG/ACT inhaler Inhale 2 puffs into the lungs every 4 (four) hours as needed.  Marland Kitchen amLODipine (NORVASC) 10 MG tablet TAKE 1 TABLET BY MOUTH EVERY DAY  . ASMANEX 60 METERED DOSES 220 MCG/INH inhaler INHALE 2 PUFFS INTO THE LUNGS DAILY.  Marland Kitchen aspirin 81 MG tablet Take  81 mg by mouth daily.  Marland Kitchen atorvastatin (LIPITOR) 20 MG tablet Take 1 tablet (20 mg total) by mouth daily at 6 PM.  . glucagon (GLUCAGON EMERGENCY) 1 MG injection Inject 1 mg into the vein as directed.    . insulin aspart (NOVOLOG) 100 UNIT/ML injection Inject 50 Units into the skin daily. Via insulin pump Basal rate  0.85 units/hr Bolus w meals = 5 units per pt  . losartan (COZAAR) 50 MG tablet Take 1 tablet (50 mg total) by mouth daily. Please keep upcoming appt for future refills. Thank you  . nebivolol (BYSTOLIC) 2.5 MG tablet Take 1 tablet (2.5 mg total) by mouth daily.  . nitroGLYCERIN (NITROSTAT) 0.4 MG SL tablet Place 1 tablet (0.4 mg total) under the tongue every 5 (five) minutes as needed for chest pain.  . ONE TOUCH ULTRA TEST test strip as directed.  Marland Kitchen RAPAFLO 8 MG CAPS capsule Take 8 mg by mouth daily.     Allergies:   Cardizem [diltiazem hcl]; Carvedilol; and Levofloxacin   Social History   Socioeconomic History  . Marital status: Married    Spouse name: Not on file  . Number of children: Not on file  . Years of education: Not on file  . Highest education level: Not on file  Occupational History  . Occupation: Pharmacist, hospital  Social Needs  . Financial resource strain:  Not on file  . Food insecurity:    Worry: Not on file    Inability: Not on file  . Transportation needs:    Medical: Not on file    Non-medical: Not on file  Tobacco Use  . Smoking status: Never Smoker  . Smokeless tobacco: Never Used  Substance and Sexual Activity  . Alcohol use: Yes    Alcohol/week: 1.8 oz    Types: 3 Cans of beer per week    Comment: 3 x weekly  . Drug use: No  . Sexual activity: Not on file  Lifestyle  . Physical activity:    Days per week: Not on file    Minutes per session: Not on file  . Stress: Not on file  Relationships  . Social connections:    Talks on phone: Not on file    Gets together: Not on file    Attends religious service: Not on file    Active member of club  or organization: Not on file    Attends meetings of clubs or organizations: Not on file    Relationship status: Not on file  Other Topics Concern  . Not on file  Social History Narrative  . Not on file     Family History: The patient's family history includes Cancer in his maternal aunt and maternal uncle; Coronary artery disease in his father; Diabetes in his brother, father, and paternal grandmother; Hypertension in his father; Hypothyroidism in his sister; Lung cancer in his mother.  ROS:   Please see the history of present illness.    ROS  All other systems reviewed and negative.   EKGs/Labs/Other Studies Reviewed:    The following studies were reviewed today: none  EKG:  EKG is  ordered today.    Recent Labs: 11/17/2016: ALT 22 03/01/2017: Creatinine, Ser 1.00   Recent Lipid Panel    Component Value Date/Time   CHOL 138 11/17/2016 0830   CHOL 126 02/01/2014 0823   TRIG 67 11/17/2016 0830   TRIG 55 02/01/2014 0823   HDL 49 11/17/2016 0830   HDL 68 02/01/2014 0823   CHOLHDL 2.8 11/17/2016 0830   CHOLHDL 2.0 09/18/2015 0814   VLDL 8 09/18/2015 0814   LDLCALC 76 11/17/2016 0830   LDLCALC 47 02/01/2014 0823    Physical Exam:    VS:  BP 126/72   Pulse 73   Ht 5\' 10"  (1.778 m)   Wt 161 lb 3.2 oz (73.1 kg)   SpO2 95%   BMI 23.13 kg/m     Wt Readings from Last 3 Encounters:  10/04/17 161 lb 3.2 oz (73.1 kg)  09/30/16 164 lb (74.4 kg)  09/04/15 159 lb (72.1 kg)     GEN:  Well nourished, well developed in no acute distress HEENT: Normal NECK: No JVD; No carotid bruits LYMPHATICS: No lymphadenopathy CARDIAC: RRR, no murmurs, rubs, gallops RESPIRATORY:  Clear to auscultation without rales, wheezing or rhonchi  ABDOMEN: Soft, non-tender, non-distended MUSCULOSKELETAL:  No edema; No deformity  SKIN: Warm and dry NEUROLOGIC:  Alert and oriented x 3 PSYCHIATRIC:  Normal affect   ASSESSMENT:    1. Coronary artery disease involving native coronary artery of  native heart without angina pectoris   2. Essential hypertension   3. History of pericarditis   4. Dyslipidemia    PLAN:    In order of problems listed above:  1.  ASCAD - nonobstructive ASCAD with 40-50% LAD by cath 2006.  He denies any anginal symptoms.  He will continue on aspirin 81 mg daily, statin and beta-blocker.  2.  HTN -BP is well controlled on exam today.  He will continue on Bystolic 2.5 mg daily, loaded pain 10 mg daily and Cozaar 50 mg daily.  3.  H/O pericarditis -he has not had any recurrence of his pericarditis pain.  4.  Hyperlipidemia -LDL goal is less than 70.  His LDL was 64 on 09/02/2017 ALT was normal at 24.  He will continue on atorvastatin 20 mg daily.   Medication Adjustments/Labs and Tests Ordered: Current medicines are reviewed at length with the patient today.  Concerns regarding medicines are outlined above.  No orders of the defined types were placed in this encounter.  No orders of the defined types were placed in this encounter.   Signed, Fransico Him, MD  10/04/2017 9:08 AM    [place]

## 2017-10-14 ENCOUNTER — Other Ambulatory Visit: Payer: Self-pay | Admitting: Pulmonary Disease

## 2017-10-20 NOTE — Patient Instructions (Addendum)
[name] [name]  10/20/2017   Your procedure is scheduled on: 10-28-17   Report to [facility] Main  Entrance    Report to admitting at 5:30AM    Call this number if you have problems the morning of surgery [phone]     Remember: Do not eat food or drink liquids :After Midnight.     Take these medicines the morning of surgery with A SIP OF WATER: AMLODIPINE, NEBIVOLO (BYSTOLIC), ASMANEX, ALBUTEROL INHALER IF NEEDED (PLEASE BRING)                                 You may not have any metal on your body including hair pins and              piercings  Do not wear jewelry, make-up, lotions, powders or perfumes, deodorant                    Men may shave face and neck.   Do not bring valuables to the hospital. [place].  Contacts, dentures or bridgework may not be worn into surgery.  Leave suitcase in the car. After surgery it may be brought to your room.                 Please read over the following fact sheets you were given: _____________________________________________________________________             How to Manage Your Diabetes Before and After Surgery  Why is it important to control my blood sugar before and after surgery? . Improving blood sugar levels before and after surgery helps healing and can limit problems. . A way of improving blood sugar control is eating a healthy diet by: o  Eating less sugar and carbohydrates o  Increasing activity/exercise o  Talking with your doctor about reaching your blood sugar goals . High blood sugars (greater than 180 mg/dL) can raise your risk of infections and slow your recovery, so you will need to focus on controlling your diabetes during the weeks before surgery. . Make sure that the doctor who takes care of your diabetes knows about your planned surgery including the date and location.  How do I manage my blood sugar before surgery? . Check  your blood sugar at least 4 times a day, starting 2 days before surgery, to make sure that the level is not too high or low. o Check your blood sugar the morning of your surgery when you wake up and every 2 hours until you get to the Short Stay unit. . If your blood sugar is less than 70 mg/dL, you will need to treat for low blood sugar: o Do not take insulin. o Treat a low blood sugar (less than 70 mg/dL) with  cup of clear juice (cranberry or apple), 4 glucose tablets, OR glucose gel. o Recheck blood sugar in 15 minutes after treatment (to make sure it is greater than 70 mg/dL). If your blood sugar is not greater than 70 mg/dL on recheck, call [phone] for further instructions. . Report your blood sugar to the short stay nurse when you get to Short Stay.  . If you are admitted to the hospital after surgery: o Your blood sugar  will be checked by the staff and you will probably be given insulin after surgery (instead of oral diabetes medicines) to make sure you have good blood sugar levels. o The goal for blood sugar control after surgery is 80-180 mg/dL.   WHAT DO I DO ABOUT MY DIABETES MEDICATION?  Marland Kitchen Do not take oral diabetes medicines (pills) the morning of surgery.  . THE DAY BEFORE SURGERY o NOVOLOG INSULIN: take morning and noon doses as normal. DO NOT TAKE BEDTIME DOSE! o INSULIN GLARGINE: take morning dose as normal       . THE MORNING OF SURGERY o NOVOLOG INSULIN :I f your CBG is greater than 220 mg/dL, you may take  of your sliding scale (correction) dose of insulin. o INSULIN GLARGINE: only take  80 PERCENT your normal dose     [facility] - Preparing for Surgery Before surgery, you can play an important role.  Because skin is not sterile, your skin needs to be as free of germs as possible.  You can reduce the number of germs on your skin by washing with CHG (chlorahexidine gluconate) soap before surgery.  CHG is an antiseptic cleaner which kills germs and bonds with the  skin to continue killing germs even after washing. Please DO NOT use if you have an allergy to CHG or antibacterial soaps.  If your skin becomes reddened/irritated stop using the CHG and inform your nurse when you arrive at Short Stay. Do not shave (including legs and underarms) for at least 48 hours prior to the first CHG shower.  You may shave your face/neck. Please follow these instructions carefully:  1.  Shower with CHG Soap the night before surgery and the  morning of Surgery.  2.  If you choose to wash your hair, wash your hair first as usual with your  normal  shampoo.  3.  After you shampoo, rinse your hair and body thoroughly to remove the  shampoo.                           4.  Use CHG as you would any other liquid soap.  You can apply chg directly  to the skin and wash                       Gently with a scrungie or clean washcloth.  5.  Apply the CHG Soap to your body ONLY FROM THE NECK DOWN.   Do not use on face/ open                           Wound or open sores. Avoid contact with eyes, ears mouth and genitals (private parts).                       Wash face,  Genitals (private parts) with your normal soap.             6.  Wash thoroughly, paying special attention to the area where your surgery  will be performed.  7.  Thoroughly rinse your body with warm water from the neck down.  8.  DO NOT shower/wash with your normal soap after using and rinsing off  the CHG Soap.                9.  Pat yourself dry with a clean towel.  10.  Wear clean pajamas.            11.  Place clean sheets on your bed the night of your first shower and do not  sleep with pets. Day of Surgery : Do not apply any lotions/deodorants the morning of surgery.  Please wear clean clothes to the hospital/surgery center.  FAILURE TO FOLLOW THESE INSTRUCTIONS MAY RESULT IN THE CANCELLATION OF YOUR SURGERY PATIENT SIGNATURE_________________________________  NURSE  SIGNATURE__________________________________  ________________________________________________________________________  WHAT IS A BLOOD TRANSFUSION? Blood Transfusion Information  A transfusion is the replacement of blood or some of its parts. Blood is made up of multiple cells which provide different functions.  Red blood cells carry oxygen and are used for blood loss replacement.  White blood cells fight against infection.  Platelets control bleeding.  Plasma helps clot blood.  Other blood products are available for specialized needs, such as hemophilia or other clotting disorders. BEFORE THE TRANSFUSION  Who gives blood for transfusions?   Healthy volunteers who are fully evaluated to make sure their blood is safe. This is blood bank blood. Transfusion therapy is the safest it has ever been in the practice of medicine. Before blood is taken from a donor, a complete history is taken to make sure that person has no history of diseases nor engages in risky social behavior (examples are intravenous drug use or sexual activity with multiple partners). The donor's travel history is screened to minimize risk of transmitting infections, such as malaria. The donated blood is tested for signs of infectious diseases, such as HIV and hepatitis. The blood is then tested to be sure it is compatible with you in order to minimize the chance of a transfusion reaction. If you or a relative donates blood, this is often done in anticipation of surgery and is not appropriate for emergency situations. It takes many days to process the donated blood. RISKS AND COMPLICATIONS Although transfusion therapy is very safe and saves many lives, the main dangers of transfusion include:   Getting an infectious disease.  Developing a transfusion reaction. This is an allergic reaction to something in the blood you were given. Every precaution is taken to prevent this. The decision to have a blood transfusion has been  considered carefully by your caregiver before blood is given. Blood is not given unless the benefits outweigh the risks. AFTER THE TRANSFUSION  Right after receiving a blood transfusion, you will usually feel much better and more energetic. This is especially true if your red blood cells have gotten low (anemic). The transfusion raises the level of the red blood cells which carry oxygen, and this usually causes an energy increase.  The nurse administering the transfusion will monitor you carefully for complications. HOME CARE INSTRUCTIONS  No special instructions are needed after a transfusion. You may find your energy is better. Speak with your caregiver about any limitations on activity for underlying diseases you may have. SEEK MEDICAL CARE IF:   Your condition is not improving after your transfusion.  You develop redness or irritation at the intravenous (IV) site. SEEK IMMEDIATE MEDICAL CARE IF:  Any of the following symptoms occur over the next 12 hours:  Shaking chills.  You have a temperature by mouth above 102 F (38.9 C), not controlled by medicine.  Chest, back, or muscle pain.  People around you feel you are not acting correctly or are confused.  Shortness of breath or difficulty breathing.  Dizziness and fainting.  You get a rash or develop  hives.  You have a decrease in urine output.  Your urine turns a dark color or changes to pink, red, or brown. Any of the following symptoms occur over the next 10 days:  You have a temperature by mouth above 102 F (38.9 C), not controlled by medicine.  Shortness of breath.  Weakness after normal activity.  The white part of the eye turns yellow (jaundice).  You have a decrease in the amount of urine or are urinating less often.  Your urine turns a dark color or changes to pink, red, or brown. Document Released: 03/26/2000 Document Revised: 06/21/2011 Document Reviewed: 11/13/2007 [facility] Patient Information 2014  [place], [place].  _______________________________________________________________________

## 2017-10-20 NOTE — Progress Notes (Signed)
[facility] , DR. Tressia Miners TURNER 10-04-17 Epic   LOV PCP DR. Delrae Rend 09-02-17 ON CHART

## 2017-10-21 ENCOUNTER — Encounter (HOSPITAL_COMMUNITY): Payer: Self-pay

## 2017-10-21 ENCOUNTER — Other Ambulatory Visit: Payer: Self-pay

## 2017-10-21 ENCOUNTER — Encounter (HOSPITAL_COMMUNITY)
Admission: RE | Admit: 2017-10-21 | Discharge: 2017-10-21 | Disposition: A | Discharge status: Home / Self Care | Payer: BC Managed Care – PPO | Source: Ambulatory Visit | Attending: Urology | Admitting: Urology

## 2017-10-21 DIAGNOSIS — E119 Type 2 diabetes mellitus without complications: Secondary | ICD-10-CM | POA: Insufficient documentation

## 2017-10-21 DIAGNOSIS — Z01818 Encounter for other preprocedural examination: Secondary | ICD-10-CM | POA: Diagnosis not present

## 2017-10-21 DIAGNOSIS — N2889 Other specified disorders of kidney and ureter: Secondary | ICD-10-CM | POA: Diagnosis not present

## 2017-10-21 DIAGNOSIS — I10 Essential (primary) hypertension: Secondary | ICD-10-CM | POA: Diagnosis not present

## 2017-10-21 LAB — ABO/RH: ABO/RH(D): A POS

## 2017-10-21 LAB — COMPREHENSIVE METABOLIC PANEL
ALT: 26 U/L (ref 0–44)
AST: 27 U/L (ref 15–41)
Albumin: 4.7 g/dL (ref 3.5–5.0)
Alkaline Phosphatase: 61 U/L (ref 38–126)
Anion gap: 8 (ref 5–15)
BUN: 20 mg/dL (ref 6–20)
CO2: 28 mmol/L (ref 22–32)
Calcium: 9.5 mg/dL (ref 8.9–10.3)
Chloride: 101 mmol/L (ref 98–111)
Creatinine, Ser: 1.14 mg/dL (ref 0.61–1.24)
GFR calc Af Amer: >60 mL/min (ref >60)
GFR calc non Af Amer: >60 mL/min (ref >60)
Glucose, Bld: 223 mg/dL — ABNORMAL HIGH (ref 70–99)
Potassium: 4.1 mmol/L (ref 3.5–5.1)
Sodium: 137 mmol/L (ref 135–145)
Total Bilirubin: 0.8 mg/dL (ref 0.3–1.2)
Total Protein: 7 g/dL (ref 6.5–8.1)

## 2017-10-21 LAB — HEMOGLOBIN A1C
Hgb A1c MFr Bld: 7.9 % — ABNORMAL HIGH (ref 4.8–5.6)
Mean Plasma Glucose: 180.03 mg/dL

## 2017-10-21 LAB — CBC
HCT: 39.8 % (ref 39.0–52.0)
Hemoglobin: 13.6 g/dL (ref 13.0–17.0)
MCH: 31.8 pg (ref 26.0–34.0)
MCHC: 34.2 g/dL (ref 30.0–36.0)
MCV: 93 fL (ref 78.0–100.0)
Platelets: 272 10*3/uL (ref 150–400)
RBC: 4.28 MIL/uL (ref 4.22–5.81)
RDW: 12.2 % (ref 11.5–15.5)
WBC: 4.7 10*3/uL (ref 4.0–10.5)

## 2017-10-21 LAB — GLUCOSE, CAPILLARY: Glucose-Capillary: 215 mg/dL — ABNORMAL HIGH (ref 70–99)

## 2017-10-22 ENCOUNTER — Other Ambulatory Visit: Payer: Self-pay | Admitting: Cardiology

## 2017-10-22 LAB — URINE CULTURE: Culture: NO GROWTH

## 2017-10-28 ENCOUNTER — Other Ambulatory Visit: Payer: Self-pay

## 2017-10-28 ENCOUNTER — Encounter (HOSPITAL_COMMUNITY): Payer: Self-pay | Admitting: Emergency Medicine

## 2017-10-28 ENCOUNTER — Inpatient Hospital Stay (HOSPITAL_COMMUNITY): Payer: BC Managed Care – PPO | Admitting: Certified Registered Nurse Anesthetist

## 2017-10-28 ENCOUNTER — Inpatient Hospital Stay (HOSPITAL_COMMUNITY)
Admission: RE | Admit: 2017-10-28 | Discharge: 2017-10-29 | DRG: 661 | Disposition: A | Discharge status: Home / Self Care | Payer: BC Managed Care – PPO | Attending: Urology | Admitting: Urology

## 2017-10-28 ENCOUNTER — Encounter (HOSPITAL_COMMUNITY)
Admission: RE | Disposition: A | Discharge status: Home / Self Care | Payer: Self-pay | Source: Home / Self Care | Attending: Urology

## 2017-10-28 DIAGNOSIS — I2510 Atherosclerotic heart disease of native coronary artery without angina pectoris: Secondary | ICD-10-CM | POA: Diagnosis present

## 2017-10-28 DIAGNOSIS — I10 Essential (primary) hypertension: Secondary | ICD-10-CM | POA: Diagnosis present

## 2017-10-28 DIAGNOSIS — Z794 Long term (current) use of insulin: Secondary | ICD-10-CM

## 2017-10-28 DIAGNOSIS — Z9079 Acquired absence of other genital organ(s): Secondary | ICD-10-CM

## 2017-10-28 DIAGNOSIS — J45909 Unspecified asthma, uncomplicated: Secondary | ICD-10-CM | POA: Diagnosis present

## 2017-10-28 DIAGNOSIS — Z833 Family history of diabetes mellitus: Secondary | ICD-10-CM | POA: Diagnosis not present

## 2017-10-28 DIAGNOSIS — I252 Old myocardial infarction: Secondary | ICD-10-CM | POA: Diagnosis not present

## 2017-10-28 DIAGNOSIS — E109 Type 1 diabetes mellitus without complications: Secondary | ICD-10-CM | POA: Diagnosis present

## 2017-10-28 DIAGNOSIS — N2889 Other specified disorders of kidney and ureter: Secondary | ICD-10-CM | POA: Diagnosis present

## 2017-10-28 HISTORY — PX: ROBOTIC ASSITED PARTIAL NEPHRECTOMY: SHX6087

## 2017-10-28 LAB — GLUCOSE, CAPILLARY
Glucose-Capillary: 111 mg/dL — ABNORMAL HIGH (ref 70–99)
Glucose-Capillary: 100 mg/dL — ABNORMAL HIGH (ref 70–99)
Glucose-Capillary: 118 mg/dL — ABNORMAL HIGH (ref 70–99)
Glucose-Capillary: 166 mg/dL — ABNORMAL HIGH (ref 70–99)
Glucose-Capillary: 189 mg/dL — ABNORMAL HIGH (ref 70–99)
Glucose-Capillary: 189 mg/dL — ABNORMAL HIGH (ref 70–99)
Glucose-Capillary: 179 mg/dL — ABNORMAL HIGH (ref 70–99)
Glucose-Capillary: 190 mg/dL — ABNORMAL HIGH (ref 70–99)
Glucose-Capillary: 311 mg/dL — ABNORMAL HIGH (ref 70–99)

## 2017-10-28 LAB — TYPE AND SCREEN
ABO/RH(D): A POS
Antibody Screen: NEGATIVE

## 2017-10-28 LAB — HEMOGLOBIN AND HEMATOCRIT, BLOOD
HCT: 41.1 % (ref 39.0–52.0)
Hemoglobin: 14 g/dL (ref 13.0–17.0)

## 2017-10-28 SURGERY — NEPHRECTOMY, PARTIAL, ROBOT-ASSISTED
Anesthesia: General | Laterality: Left

## 2017-10-28 MED ORDER — MIDAZOLAM HCL 5 MG/5ML IJ SOLN
INTRAMUSCULAR | Status: DC | PRN
  Administered 2017-10-28: 2 mg via INTRAVENOUS

## 2017-10-28 MED ORDER — CEFAZOLIN SODIUM-DEXTROSE 2-4 GM/100ML-% IV SOLN
2.0000 g | INTRAVENOUS | Status: AC
  Administered 2017-10-28: 2 g via INTRAVENOUS
  Filled 2017-10-28: qty 100

## 2017-10-28 MED ORDER — ONDANSETRON HCL 4 MG/2ML IJ SOLN
INTRAMUSCULAR | Status: AC
  Filled 2017-10-28: qty 2

## 2017-10-28 MED ORDER — AMLODIPINE BESYLATE 10 MG PO TABS
10.0000 mg | ORAL_TABLET | Freq: Every day | ORAL | Status: DC
  Administered 2017-10-29: 10 mg via ORAL
  Filled 2017-10-28: qty 1

## 2017-10-28 MED ORDER — FENTANYL CITRATE (PF) 100 MCG/2ML IJ SOLN
INTRAMUSCULAR | Status: DC | PRN
  Administered 2017-10-28: 100 ug via INTRAVENOUS
  Administered 2017-10-28: 50 ug via INTRAVENOUS

## 2017-10-28 MED ORDER — HYDROMORPHONE HCL 1 MG/ML IJ SOLN
0.2500 mg | INTRAMUSCULAR | Status: DC | PRN
  Administered 2017-10-28 (×3): 0.5 mg via INTRAVENOUS

## 2017-10-28 MED ORDER — HYDROMORPHONE HCL 1 MG/ML IJ SOLN
0.5000 mg | INTRAMUSCULAR | Status: DC | PRN
  Administered 2017-10-28 – 2017-10-29 (×4): 1 mg via INTRAVENOUS
  Filled 2017-10-28 (×4): qty 1

## 2017-10-28 MED ORDER — MEPERIDINE HCL 50 MG/ML IJ SOLN: 6.2500 mg | INTRAMUSCULAR | Status: DC | PRN

## 2017-10-28 MED ORDER — LACTATED RINGERS IR SOLN
Status: DC | PRN
  Administered 2017-10-28: 1000 mL

## 2017-10-28 MED ORDER — THROMBIN (RECOMBINANT) 5000 UNITS EX SOLR
CUTANEOUS | Status: AC
  Filled 2017-10-28: qty 10000

## 2017-10-28 MED ORDER — SODIUM CHLORIDE 0.9 % IJ SOLN
INTRAMUSCULAR | Status: DC | PRN
  Administered 2017-10-28: 12 mL

## 2017-10-28 MED ORDER — LACTATED RINGERS IV SOLN
INTRAVENOUS | Status: DC
  Administered 2017-10-28 (×3): via INTRAVENOUS

## 2017-10-28 MED ORDER — ONDANSETRON HCL 4 MG/2ML IJ SOLN: 4.0000 mg | INTRAMUSCULAR | Status: DC | PRN

## 2017-10-28 MED ORDER — PROMETHAZINE HCL 25 MG/ML IJ SOLN
6.2500 mg | INTRAMUSCULAR | Status: DC | PRN
  Administered 2017-10-28: 12.5 mg via INTRAVENOUS

## 2017-10-28 MED ORDER — PROMETHAZINE HCL 25 MG/ML IJ SOLN
INTRAMUSCULAR | Status: AC
  Filled 2017-10-28: qty 1

## 2017-10-28 MED ORDER — ROCURONIUM BROMIDE 10 MG/ML (PF) SYRINGE
PREFILLED_SYRINGE | INTRAVENOUS | Status: DC | PRN
  Administered 2017-10-28: 10 mg via INTRAVENOUS
  Administered 2017-10-28: 60 mg via INTRAVENOUS

## 2017-10-28 MED ORDER — PROPOFOL 10 MG/ML IV BOLUS
INTRAVENOUS | Status: AC
  Filled 2017-10-28: qty 20

## 2017-10-28 MED ORDER — FENTANYL CITRATE (PF) 100 MCG/2ML IJ SOLN
INTRAMUSCULAR | Status: AC
  Filled 2017-10-28: qty 2

## 2017-10-28 MED ORDER — ALBUTEROL SULFATE (2.5 MG/3ML) 0.083% IN NEBU: 2.5000 mg | INHALATION_SOLUTION | RESPIRATORY_TRACT | Status: DC | PRN

## 2017-10-28 MED ORDER — SODIUM CHLORIDE 0.45 % IV SOLN
INTRAVENOUS | Status: DC
Start: 2017-10-28 — End: 2017-10-29
  Administered 2017-10-28: 15:00:00 via INTRAVENOUS

## 2017-10-28 MED ORDER — BUPIVACAINE-EPINEPHRINE 0.5% -1:200000 IJ SOLN
INTRAMUSCULAR | Status: DC | PRN
  Administered 2017-10-28: 30 mL

## 2017-10-28 MED ORDER — MIDAZOLAM HCL 2 MG/2ML IJ SOLN
INTRAMUSCULAR | Status: AC
  Filled 2017-10-28: qty 2

## 2017-10-28 MED ORDER — GLUCAGON (RDNA) 1 MG IJ KIT: 1.0000 mg | PACK | Freq: Once | INTRAMUSCULAR | Status: DC | PRN

## 2017-10-28 MED ORDER — LIDOCAINE HCL 2 % IJ SOLN
INTRAMUSCULAR | Status: AC
Start: 2017-10-28 — End: ?
  Filled 2017-10-28: qty 20

## 2017-10-28 MED ORDER — NEBIVOLOL HCL 2.5 MG PO TABS
2.5000 mg | ORAL_TABLET | Freq: Every day | ORAL | Status: DC
  Administered 2017-10-29: 2.5 mg via ORAL
  Filled 2017-10-28: qty 1

## 2017-10-28 MED ORDER — KETAMINE HCL 10 MG/ML IJ SOLN
INTRAMUSCULAR | Status: AC
  Filled 2017-10-28: qty 1

## 2017-10-28 MED ORDER — PROPOFOL 10 MG/ML IV BOLUS
INTRAVENOUS | Status: DC | PRN
  Administered 2017-10-28: 200 mg via INTRAVENOUS

## 2017-10-28 MED ORDER — ATORVASTATIN CALCIUM 20 MG PO TABS
20.0000 mg | ORAL_TABLET | Freq: Every day | ORAL | Status: DC
  Administered 2017-10-28: 20 mg via ORAL
  Filled 2017-10-28 (×2): qty 1

## 2017-10-28 MED ORDER — ONDANSETRON HCL 4 MG/2ML IJ SOLN
INTRAMUSCULAR | Status: DC | PRN
  Administered 2017-10-28 (×2): 4 mg via INTRAVENOUS

## 2017-10-28 MED ORDER — INSULIN ASPART 100 UNIT/ML ~~LOC~~ SOLN
0.0000 [IU] | SUBCUTANEOUS | Status: DC
  Administered 2017-10-28: 4 [IU] via SUBCUTANEOUS
  Administered 2017-10-28: 2 [IU] via SUBCUTANEOUS
  Administered 2017-10-29 (×2): 11 [IU] via SUBCUTANEOUS
  Administered 2017-10-29: 5 [IU] via SUBCUTANEOUS

## 2017-10-28 MED ORDER — SUGAMMADEX SODIUM 200 MG/2ML IV SOLN
INTRAVENOUS | Status: DC | PRN
  Administered 2017-10-28: 200 mg via INTRAVENOUS

## 2017-10-28 MED ORDER — INSULIN ASPART 100 UNIT/ML ~~LOC~~ SOLN
2.0000 [IU] | Freq: Once | SUBCUTANEOUS | Status: AC
  Administered 2017-10-28: 2 [IU] via SUBCUTANEOUS

## 2017-10-28 MED ORDER — LOSARTAN POTASSIUM 50 MG PO TABS
50.0000 mg | ORAL_TABLET | Freq: Every day | ORAL | Status: DC
  Administered 2017-10-28 – 2017-10-29 (×2): 50 mg via ORAL
  Filled 2017-10-28 (×2): qty 1

## 2017-10-28 MED ORDER — BUPIVACAINE-EPINEPHRINE (PF) 0.5% -1:200000 IJ SOLN
INTRAMUSCULAR | Status: AC
  Filled 2017-10-28: qty 30

## 2017-10-28 MED ORDER — EPHEDRINE SULFATE-NACL 50-0.9 MG/10ML-% IV SOSY
PREFILLED_SYRINGE | INTRAVENOUS | Status: DC | PRN
  Administered 2017-10-28: 10 mg via INTRAVENOUS

## 2017-10-28 MED ORDER — HYDROMORPHONE HCL 1 MG/ML IJ SOLN
INTRAMUSCULAR | Status: AC
  Filled 2017-10-28: qty 2

## 2017-10-28 MED ORDER — SODIUM CHLORIDE 0.9 % IJ SOLN
INTRAMUSCULAR | Status: AC
  Filled 2017-10-28: qty 20

## 2017-10-28 MED ORDER — LIDOCAINE 2% (20 MG/ML) 5 ML SYRINGE
INTRAMUSCULAR | Status: DC | PRN
  Administered 2017-10-28: 1.5 mg/kg/h via INTRAVENOUS

## 2017-10-28 MED ORDER — LACTATED RINGERS IV SOLN: INTRAVENOUS | Status: DC

## 2017-10-28 MED ORDER — DIPHENHYDRAMINE HCL 12.5 MG/5ML PO ELIX: 12.5000 mg | ORAL_SOLUTION | Freq: Four times a day (QID) | ORAL | Status: DC | PRN

## 2017-10-28 MED ORDER — DEXAMETHASONE SODIUM PHOSPHATE 10 MG/ML IJ SOLN
INTRAMUSCULAR | Status: DC | PRN
  Administered 2017-10-28: 5 mg via INTRAVENOUS

## 2017-10-28 MED ORDER — NITROGLYCERIN 0.4 MG SL SUBL: 0.4000 mg | SUBLINGUAL_TABLET | SUBLINGUAL | Status: DC | PRN

## 2017-10-28 MED ORDER — LIDOCAINE 2% (20 MG/ML) 5 ML SYRINGE
INTRAMUSCULAR | Status: DC | PRN
  Administered 2017-10-28: 80 mg via INTRAVENOUS

## 2017-10-28 MED ORDER — KETAMINE HCL 10 MG/ML IJ SOLN
INTRAMUSCULAR | Status: DC | PRN
  Administered 2017-10-28: 30 mg via INTRAVENOUS
  Administered 2017-10-28: 10 mg via INTRAVENOUS

## 2017-10-28 MED ORDER — TRAMADOL HCL 50 MG PO TABS: 50.0000 mg | ORAL_TABLET | Freq: Four times a day (QID) | ORAL | 0 refills | Status: DC | PRN

## 2017-10-28 MED ORDER — EPHEDRINE 5 MG/ML INJ
INTRAVENOUS | Status: AC
  Filled 2017-10-28: qty 10

## 2017-10-28 MED ORDER — STERILE WATER FOR IRRIGATION IR SOLN
Status: DC | PRN
  Administered 2017-10-28: 1000 mL

## 2017-10-28 MED ORDER — LIDOCAINE HCL 2 % IJ SOLN
INTRAMUSCULAR | Status: AC
  Filled 2017-10-28: qty 40

## 2017-10-28 MED ORDER — ACETAMINOPHEN 10 MG/ML IV SOLN
1000.0000 mg | Freq: Four times a day (QID) | INTRAVENOUS | Status: AC
  Administered 2017-10-28 – 2017-10-29 (×4): 1000 mg via INTRAVENOUS
  Filled 2017-10-28 (×5): qty 100

## 2017-10-28 MED ORDER — SUGAMMADEX SODIUM 200 MG/2ML IV SOLN
INTRAVENOUS | Status: AC
  Filled 2017-10-28: qty 2

## 2017-10-28 MED ORDER — BUPIVACAINE LIPOSOME 1.3 % IJ SUSP
20.0000 mL | Freq: Once | INTRAMUSCULAR | Status: AC
  Administered 2017-10-28: 20 mL
  Filled 2017-10-28: qty 20

## 2017-10-28 MED ORDER — TRAMADOL HCL 50 MG PO TABS: 50.0000 mg | ORAL_TABLET | Freq: Four times a day (QID) | ORAL | Status: DC | PRN

## 2017-10-28 MED ORDER — DIPHENHYDRAMINE HCL 50 MG/ML IJ SOLN: 12.5000 mg | Freq: Four times a day (QID) | INTRAMUSCULAR | Status: DC | PRN

## 2017-10-28 MED ORDER — DEXAMETHASONE SODIUM PHOSPHATE 10 MG/ML IJ SOLN
INTRAMUSCULAR | Status: AC
  Filled 2017-10-28: qty 1

## 2017-10-28 SURGICAL SUPPLY — 58 items
ADH SKN CLS APL DERMABOND .7 (GAUZE/BANDAGES/DRESSINGS) ×1
APL ESCP 34 STRL LF DISP (HEMOSTASIS) ×1
APL SRG 38 LTWT LNG FL B (MISCELLANEOUS)
APPLICATOR ARISTA FLEXITIP XL (MISCELLANEOUS) IMPLANT
APPLICATOR SURGIFLO ENDO (HEMOSTASIS) ×1 IMPLANT
BAG SPEC RTRVL LRG 6X4 10 (ENDOMECHANICALS) ×1
CHLORAPREP W/TINT 26ML (MISCELLANEOUS) ×2 IMPLANT
CLIP VESOLOCK LG 6/CT PURPLE (CLIP) ×2 IMPLANT
CLIP VESOLOCK MED LG 6/CT (CLIP) ×4 IMPLANT
COVER SURGICAL LIGHT HANDLE (MISCELLANEOUS) ×2 IMPLANT
COVER TIP SHEARS 8 DVNC (MISCELLANEOUS) ×1 IMPLANT
COVER TIP SHEARS 8MM DA VINCI (MISCELLANEOUS) ×1
DECANTER SPIKE VIAL GLASS SM (MISCELLANEOUS) ×2 IMPLANT
DERMABOND ADVANCED (GAUZE/BANDAGES/DRESSINGS) ×1
DERMABOND ADVANCED .7 DNX12 (GAUZE/BANDAGES/DRESSINGS) ×1 IMPLANT
DRAIN CHANNEL 15F RND FF 3/16 (WOUND CARE) ×2 IMPLANT
DRAPE ARM DVNC X/XI (DISPOSABLE) ×4 IMPLANT
DRAPE COLUMN DVNC XI (DISPOSABLE) ×1 IMPLANT
DRAPE DA VINCI XI ARM (DISPOSABLE) ×4
DRAPE DA VINCI XI COLUMN (DISPOSABLE) ×1
DRAPE INCISE IOBAN 66X45 STRL (DRAPES) ×2 IMPLANT
DRAPE SHEET LG 3/4 BI-LAMINATE (DRAPES) ×2 IMPLANT
ELECT PENCIL ROCKER SW 15FT (MISCELLANEOUS) ×2 IMPLANT
ELECT REM PT RETURN 15FT ADLT (MISCELLANEOUS) ×3 IMPLANT
EVACUATOR SILICONE 100CC (DRAIN) ×2 IMPLANT
GLOVE BIO SURGEON STRL SZ 6.5 (GLOVE) ×2 IMPLANT
GLOVE BIOGEL M STRL SZ7.5 (GLOVE) ×4 IMPLANT
GOWN STRL REUS W/TWL LRG LVL3 (GOWN DISPOSABLE) ×6 IMPLANT
HEMOSTAT ARISTA ABSORB 3G PWDR (MISCELLANEOUS) IMPLANT
IRRIG SUCT STRYKERFLOW 2 WTIP (MISCELLANEOUS) ×2
IRRIGATION SUCT STRKRFLW 2 WTP (MISCELLANEOUS) IMPLANT
KIT BASIN OR (CUSTOM PROCEDURE TRAY) ×2 IMPLANT
MARKER SKIN DUAL TIP RULER LAB (MISCELLANEOUS) ×2 IMPLANT
NDL INSUFFLATION 14GA 120MM (NEEDLE) IMPLANT
NEEDLE INSUFFLATION 14GA 120MM (NEEDLE) IMPLANT
PAD POSITIONING PINK XL (MISCELLANEOUS) ×2 IMPLANT
PORT ACCESS TROCAR AIRSEAL 12 (TROCAR) ×1 IMPLANT
PORT ACCESS TROCAR AIRSEAL 5M (TROCAR) ×1
POSITIONER SURGICAL ARM (MISCELLANEOUS) ×4 IMPLANT
POUCH SPECIMEN RETRIEVAL 10MM (ENDOMECHANICALS) ×2 IMPLANT
SEAL CANN UNIV 5-8 DVNC XI (MISCELLANEOUS) ×4 IMPLANT
SEAL XI 5MM-8MM UNIVERSAL (MISCELLANEOUS) ×4
SET TRI-LUMEN FLTR TB AIRSEAL (TUBING) ×2 IMPLANT
SOLUTION ELECTROLUBE (MISCELLANEOUS) ×2 IMPLANT
SUT ETHILON 3 0 PS 1 (SUTURE) ×2 IMPLANT
SUT MNCRL AB 4-0 PS2 18 (SUTURE) ×4 IMPLANT
SUT V-LOC BARB 180 2/0GR6 GS22 (SUTURE) ×2
SUT VIC AB 0 CT1 27 (SUTURE) ×2
SUT VIC AB 0 CT1 27XBRD ANTBC (SUTURE) ×1 IMPLANT
SUT VICRYL 0 UR6 27IN ABS (SUTURE) ×2 IMPLANT
SUT VLOC BARB 180 ABS3/0GR12 (SUTURE) ×2
SUTURE V-LC BRB 180 2/0GR6GS22 (SUTURE) ×1 IMPLANT
SUTURE VLOC BRB 180 ABS3/0GR12 (SUTURE) ×1 IMPLANT
TOWEL OR 17X26 10 PK STRL BLUE (TOWEL DISPOSABLE) ×2 IMPLANT
TOWEL OR NON WOVEN STRL DISP B (DISPOSABLE) ×2 IMPLANT
TRAY FOLEY MTR SLVR 16FR STAT (SET/KITS/TRAYS/PACK) ×2 IMPLANT
TRAY LAPAROSCOPIC (CUSTOM PROCEDURE TRAY) ×2 IMPLANT
TROCAR XCEL 12X100 BLDLESS (ENDOMECHANICALS) IMPLANT

## 2017-10-28 NOTE — Progress Notes (Signed)
Post-op note  Subjective: The patient is doing well.  No complaints.  Objective: Vital signs in last 24 hours: Temp:  [97.5 F (36.4 C)-99 F (37.2 C)] 97.6 F (36.4 C) (07/19 1244) Pulse Rate:  [72-80] 75 (07/19 1244) Resp:  [10-20] 20 (07/19 1244) BP: (124-141)/(75-81) 135/76 (07/19 1244) SpO2:  [97 %-100 %] 100 % (07/19 1244) Weight:  [72.6 kg (160 lb)] 72.6 kg (160 lb) (07/19 0553)  Intake/Output from previous day: No intake/output data recorded. Intake/Output this shift: Total I/O In: 2802.5 [I.V.:2702.5; IV Piggyback:100] Out: 360 [Urine:245; Drains:85; Blood:30]  Physical Exam:  General: Alert and oriented. Abdomen: Soft, Nondistended, appropriately tender, incisions c/d/i, JP drain with thin s/s fluid in bulb Incisions: Clean and dry. GU: foley catheter to drainage, draining clear yellow urine  Lab Results: Recent Labs    10/28/17 1139  HGB 14.0  HCT 41.1    Assessment/Plan: POD#0 s/p L robotic partial nephrectomy. Doing well.  1) Continue to monitor 2) Likely discharge tomorrow   Neira Bentsen Rob Bunting, MD   LOS: 0 days   Lumina Gitto Rob Bunting 10/28/2017, 4:26 PM

## 2017-10-28 NOTE — Anesthesia Preprocedure Evaluation (Addendum)
Anesthesia Evaluation  Patient identified by MRN, date of birth, ID band Patient awake    Reviewed: Allergy & Precautions, NPO status , Patient's Chart, lab work & pertinent test results  Airway Mallampati: I  TM Distance: >3 FB Neck ROM: Full    Dental  (+) Teeth Intact, Dental Advisory Given   Pulmonary asthma ,    breath sounds clear to auscultation       Cardiovascular hypertension, Pt. on home beta blockers and Pt. on medications + CAD   Rhythm:Regular Rate:Normal     Neuro/Psych negative neurological ROS  negative psych ROS   GI/Hepatic negative GI ROS, Neg liver ROS,   Endo/Other  diabetes, Type 1, Insulin Dependent  Renal/GU Renal disease     Musculoskeletal negative musculoskeletal ROS (+)   Abdominal Normal abdominal exam  (+)   Peds  Hematology   Anesthesia Other Findings   Reproductive/Obstetrics                            Anesthesia Physical Anesthesia Plan  ASA: [name]  Anesthesia Plan: General   Post-op Pain Management:    Induction: Intravenous  PONV Risk Score and Plan: 3 and Ondansetron, Dexamethasone and Midazolam  Airway Management Planned: Oral ETT  Additional Equipment: None  Intra-op Plan:   Post-operative Plan: Extubation in OR  Informed Consent: I have reviewed the patients History and Physical, chart, labs and discussed the procedure including the risks, benefits and alternatives for the proposed anesthesia with the patient or authorized representative who has indicated his/her understanding and acceptance.   Dental advisory given  Plan Discussed with: CRNA  Anesthesia Plan Comments:        Lab Results  Component Value Date   WBC 4.7 10/21/2017   HGB 13.6 10/21/2017   HCT 39.8 10/21/2017   MCV 93.0 10/21/2017   PLT 272 10/21/2017   Lab Results  Component Value Date   CREATININE 1.14 10/21/2017   BUN 20 10/21/2017   NA 137 10/21/2017    K 4.1 10/21/2017   CL 101 10/21/2017   CO2 28 10/21/2017   No results found for: INR, PROTIME   Anesthesia Quick Evaluation

## 2017-10-28 NOTE — Anesthesia Postprocedure Evaluation (Signed)
Anesthesia Post Note  Patient: [name] [name]  Procedure(s) Performed: XI ROBOTIC ASSITED PARTIAL LEFT NEPHRECTOMY (Left )     Patient location during evaluation: PACU Anesthesia Type: General Level of consciousness: awake and alert Pain management: pain level controlled Vital Signs Assessment: post-procedure vital signs reviewed and stable Respiratory status: spontaneous breathing, nonlabored ventilation, respiratory function stable and patient connected to nasal cannula oxygen Cardiovascular status: blood pressure returned to baseline and stable Postop Assessment: no apparent nausea or vomiting Anesthetic complications: no    Last Vitals:  Vitals:   10/28/17 1215 10/28/17 1244  BP: 130/79 135/76  Pulse: 74 75  Resp: 12 20  Temp: 36.4 C 36.4 C  SpO2: 100% 100%    Last Pain:  Vitals:   10/28/17 1244  TempSrc: Oral  PainSc:                  Effie Berkshire

## 2017-10-28 NOTE — H&P (Signed)
Renal Mass Surveillance  HPI: [name] [name] is a 57 year-old male established patient who is here ongoing eval and management of a renal mass.  The mass is on the left side.   The lesion(s) was first noted on 07/16/2014. The mass was seen on MRI Scan.   The mass was last imaged 1 week ago. The area was described as Endophytic right upper pole posterior lateral tumor. The mass has changed in size since last imaging study. It has grown 52mm in 6 months.   He has had no symptoms. He has not seen blood in his urine. He does have a good appetite. He is not having pain in new locations. He has not recently had unwanted weight loss.   He has not had previous abdominal surgery. The patient can walk a flight of steps.   The patient's past medical history is significant for diabetes. There is not a a family history of kidney cancer. There is no family history of brain tumors (AMLs), seizures or brain aneurysm's.   MRI, 08/30/14: 0.80.6 cm  MRI, 08/22/15: 1.3x0.9 cm  MRI,03/02/16: 1.41.1 cm  MRI, 02/27/17: 1.9x1.6  CT 08/29/17: 2.0 x 1.6cm   The patient presents today for discussion of surgery prior to the upcoming left sided partial nephrectomy. The patient is a type I diabetic, his last A1c was around 8.2. He is in otherwise good shape and has no trouble with any exercise intolerance. He has a history of heart attack.   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 It has grown quite substantially over the past 3 years. It has mild enhancement. The renal hilum is simple.     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 has mildly increased in size., - 03/09/2016 BPH w/LUTS, Benign prostatic hyperplasia with urinary obstruction - 09/05/2015 ED due to arterial insufficiency, Erectile dysfunction due to arterial insufficiency - 09/05/2015 Encounter for Prostate Cancer screening, Prostate cancer screening - 09/05/2015 Renal cyst, Renal cyst, acquired, right - 09/05/2015 Urinary Retention, Unspec, Incomplete bladder emptying - 05-Aug-2014 Renal calculus, Kidney stone on left side - 08/05/14 Disorder of male genital organs, unspecified, Epididymal mass - 08-04-13 Inflammatory Disease Prostate, Unspec, Prostatitis - 08/04/2012      PMH Notes: The patient has a 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 Currently using sildenafil.   NON-GU PMH: Encounter for general adult medical examination without abnormal findings, Encounter for preventive health examination - 08-05-14 Asthma, Asthma - 08/04/12 Personal history of other diseases of the circulatory system, History of hypertension - 08-04-12 Personal history of other endocrine, nutritional and metabolic disease, History of hypercholesterolemia - 04-Aug-2012, History of diabetes mellitus, - 08/04/12    FAMILY HISTORY: Death In The Family Mother - Runs In Family Diabetes - [place] Status Number - Runs In Family Lung Cancer - Mother, Runs In Family   SOCIAL HISTORY: Marital Status: Married Preferred Language:  English; Ethnicity: Not Hispanic Or Latino; Race: White     Notes: Alcohol Use, Never A Smoker, Occupation:, Marital History - Currently Married, Caffeine Use   REVIEW OF SYSTEMS:    GU Review Male:   Patient denies frequent urination, hard to postpone urination, burning/ pain with urination, get up at night to urinate, stream starts and stops, trouble starting your  stream, have to strain to urinate , erection problems, and penile pain.  Gastrointestinal (Upper):   Patient denies nausea, vomiting, and indigestion/ heartburn.  Gastrointestinal (Lower):   Patient denies diarrhea and constipation.  Constitutional:   Patient denies fever, night sweats, weight loss, and fatigue.  Skin:   Patient denies skin rash/ lesion and itching.  Eyes:   Patient denies blurred vision and double vision.  Ears/ Nose/ Throat:   Patient denies sore throat and sinus problems.  Hematologic/Lymphatic:   Patient denies swollen glands and easy bruising.  Cardiovascular:   Patient denies leg swelling and chest pains.  Respiratory:   Patient denies cough and shortness of breath.  Endocrine:   Patient denies excessive thirst.  Musculoskeletal:   Patient denies back pain and joint pain.  Neurological:   Patient denies headaches and dizziness.  Psychologic:   Patient denies anxiety and depression.   Notes: Updated from previous visit 03/10/2017 with review from patient as noted above.   VITAL SIGNS:      09/01/2017 02:43 PM  Weight 155 lb / 70.31 kg  Height 70 in / 177.8 cm  BP 112/67 mmHg  Pulse 74 /min  BMI 22.2 kg/m   MULTI-SYSTEM PHYSICAL EXAMINATION:    Constitutional: Well-nourished. No physical deformities. Normally developed. Good grooming.  Neck: Neck symmetrical, not swollen. Normal tracheal position.  Respiratory: No labored breathing, no use of accessory muscles. Normal breath sounds.  Cardiovascular: Regular rate and rhythm. No murmur, no gallop. Normal temperature, normal extremity pulses, no swelling, no varicosities.  Lymphatic: No enlargement of neck, axillae, groin.  Skin: No paleness, no jaundice, no cyanosis. No lesion, no ulcer, no rash.  Neurologic / Psychiatric: Oriented to time, oriented to place, oriented to person. No depression, no anxiety, no agitation.  Gastrointestinal: No mass, no tenderness, no rigidity, non obese abdomen.  Eyes: Normal  conjunctivae. Normal eyelids.  Ears, Nose, Mouth, and Throat: Left ear no scars, no lesions, no masses. Right ear no scars, no lesions, no masses. Nose no scars, no lesions, no masses. Normal hearing. Normal lips.  Musculoskeletal: Normal gait and station of head and neck.     PAST DATA REVIEWED:  Source Of History:  Patient  Records Review:   Pathology Reports, Previous Doctor Records, Previous Patient Records, POC Tool  X-Ray Review: C.T. Abdomen/Pelvis: Reviewed Films.  MRI Abdomen: Reviewed Films. Discussed With Patient.     08/23/15 09/04/14  PSA  Total PSA 0.96  1.13     PROCEDURES:          Urinalysis Dipstick Dipstick Cont'd  Color: Yellow Bilirubin: Neg  Appearance: Clear Ketones: Neg  Specific Gravity: 1.025 Blood: Neg  pH: 6.0 Protein: Trace  Glucose: Neg Urobilinogen: 0.2    Nitrites: Neg    Leukocyte Esterase: Neg    ASSESSMENT:      ICD-10 Details  1 GU:   Benign Neo Kidney, Unspec - D30.00    PLAN:           Document Letter(s):  Created for Patient: Clinical Summary    The patient has been given the natural history of renal cancer,  treatment options, and I recommended surgical extirpation for this patient. I went over the robotic-assisted laparoscopic partial nephrectomy approach. I described for the patient the procedure in detail including port placement. I detailed the postoperative course including the fact that the patient would have both a drain and a Foley catheter following the surgery. I told the patient that most often patients are discharged on postoperative day one or 2. I then detailed the expected recovery time, I told the patient that he would not be able to lift anything greater than 20 pounds for 4 weeks. I also went over the risks and benefits of this operation in great detail. We discussed the risk of injury to surrounding structures, major blood vessels and nerves, bleeding, infection, loss of kidney, and the risk of recurrent cancer.          Notes:   The patient has an endophytic enhancing mass which we have monitored for the past 3 years and demonstrated significant growth. Assess, I recommended treatment and at this point the patient's scheduled seems for surgery. He is otherwise relatively healthy. I spent quite a bit of time explaining to him the surgery and the expected outcomes as well as the expected hospital course. All questions were answered.

## 2017-10-28 NOTE — Discharge Instructions (Signed)

## 2017-10-28 NOTE — Anesthesia Procedure Notes (Signed)
Procedure Name: Intubation Date/Time: 10/28/2017 7:32 AM Performed by: [place] Nimisha Rathel C, CRNA Pre-anesthesia Checklist: Patient identified, Emergency Drugs available, Suction available and Patient being monitored Patient Re-evaluated:Patient Re-evaluated prior to induction Oxygen Delivery Method: Circle system utilized Preoxygenation: Pre-oxygenation with 100% oxygen Induction Type: IV induction Ventilation: Mask ventilation without difficulty Laryngoscope Size: Mac and 4 Grade View: Grade I Tube type: Oral Tube size: 7.5 mm Number of attempts: 1 Airway Equipment and Method: Stylet and Oral airway Placement Confirmation: ETT inserted through vocal cords under direct vision,  positive ETCO2 and breath sounds checked- equal and bilateral Secured at: 22 cm Tube secured with: Tape Dental Injury: Teeth and Oropharynx as per pre-operative assessment

## 2017-10-28 NOTE — Transfer of Care (Signed)
Immediate Anesthesia Transfer of Care Note  Patient: [name] [name]  Procedure(s) Performed: XI ROBOTIC ASSITED PARTIAL LEFT NEPHRECTOMY (Left )  Patient Location: PACU  Anesthesia Type:General  Level of Consciousness: sedated, patient cooperative and responds to stimulation  Airway & Oxygen Therapy: Patient Spontanous Breathing and Patient connected to face mask oxygen  Post-op Assessment: Report given to RN and Post -op Vital signs reviewed and stable  Post vital signs: Reviewed and stable  Last Vitals:  Vitals Value Taken Time  BP    Temp    Pulse 74 10/28/2017 11:18 AM  Resp 16 10/28/2017 11:18 AM  SpO2 100 % 10/28/2017 11:18 AM  Vitals shown include unvalidated device data.  Last Pain:  Vitals:   10/28/17 0606  TempSrc: Oral  PainSc:       Patients Stated Pain Goal: 4 (16/07/37 1062)  Complications: No apparent anesthesia complications

## 2017-10-28 NOTE — Op Note (Addendum)
Preoperative diagnosis:  1. Left renal mass   Postoperative diagnosis:  1. Same   Procedure: 1. Robotic assisted laparoscopic left partial nephrectomy  Surgeon: Ardis Hughs, MD Resident: Case M. Clydene Laming, MD 1st assistant: Debbrah Alar, PA  Anesthesia: General  Complications: None  Intraoperative findings:  #1. ~2cm endophytic left posterior upper pole renal mass #2. Warm ischemia time 16 minutes  EBL: 30 mL  Specimens: left renal mass   Indication: [name] [name] is a 57 y.o. patient with left renal mass.  After reviewing the management options for treatment, he elected to proceed with the above surgical procedure(s). We have discussed the potential benefits and risks of the procedure, side effects of the proposed treatment, the likelihood of the patient achieving the goals of the procedure, and any potential problems that might occur during the procedure or recuperation. Informed consent has been obtained.  Description of procedure:  An assistant was required for this surgical procedure.  The duties of the assistant included but were not limited to suctioning, passing suture, camera manipulation, retraction. This procedure would not be able to be performed without an Environmental consultant.  The patient was taken to the operating room and a general anesthetic was administered. The patient was given preoperative antibiotics, placed in the right modified flank position with care to pad all potential pressure points, and prepped and draped in the usual sterile fashion. Next a preoperative timeout was performed.  A site was selected on the left side of the umbilicus for placement of the camera port. This was placed using a standard modified Hassan technique with entry into the peritoneum with a  8 mm tocar. We entered the peritoneum without incident and established pneumoperitoneum. The camera was then used to inspect the abdomen and there was no evidence of any intra-abdominal injuries or  other abnormalities. The remaining abdominal ports were then placed. 8 mm robotic ports were placed in the left upper quadrant, left lower quadrant, and left lateral abdominal wall, making a soft J. A 12 mm port was placed in the midline, superior to the umbilicus for laparoscopic assistance. All ports were placed under direct vision without difficulty. The surgical cart was then docked.   Utilizing the cautery scissors, the white line of Toldt was incised allowing the colon to be mobilized medially and the plane between the mesocolon and the anterior layer of Gerota's fascia to be developed and the kidney to be exposed. The ureter and gonadal vein were identified inferiorly and the ureter was lifted anteriorly off the psoas muscle. Dissection proceeded superiorly along the gonadal vein until the renal vein was identified. The renal hilum was then carefully isolated with a combination of blunt and sharp dissection allowing the renal arterial and venous structures to be separated and isolated in preparation for renal hilar vessel clamping.  Attention turned to the kidney and the perinephric fat surrounding the renal mass was removed and the kidney was mobilized medially sufficiently for exposure and resection of the renal mass.   Once the renal mass was properly isolated with the assistance of ultrasound, preparations were made for resection of the tumor. The renal artery was then clamped with a bulldog clamp. The tumor was then excised with cold scissor dissection along with an adequate visible gross margin of normal renal parenchyma. The tumor appeared to be excised without any gross violation of the tumor. The renal collecting system was not entered during removal of the tumor. Parenchyma from the resection bed was sent for frozen section  and returned a normal parenchyma. A running 3-0 V-lock suture was then brought through the capsule of the kidney and run along the base of the renal defect to  provide hemostasis and close any entry into the renal collecting system if present. Weck clips were used to secure this suture outside the renal capsule at the proximal and distal ends. The bulldog clamps were then removed from the renal hilar vessel. A running 2-0 V lock suture was then used to close the capsule of the kidney using a sliding clip technique which resulted in excellent hemostasis. An additional hemostatic agent (Surgiflo) was then placed into the renal defect and along the renal hilum.   Total warm renal ischemia time was 16 minutes. The renal tumor resection site was examined. Hemostasis appeared adequate.   The kidney was placed back into its normal anatomic position and covered with perinephric fat as needed using Weck clips. A # 10 Blake drain was then brought through the lateral lower port site and positioned in the perinephric space. It was secured to the skin with a nylon suture. The surgical robotic cart was undocked. The renal tumor specimen was removed intact within an endopouch retrieval bag via the assistant port site. The 12 mm port site was then closed at the fascial level with 0-vicryl suture. All other laparoscopic/robotic ports were removed under direct vision and the pneumoperitoneum let down with inspection of the operative field performed and hemostasis again confirmed. All incision sites were then injected with local anesthetic and reapproximated at the skin level with 4-0 monocryl subcuticular closures. Dermabond was applied to the skin. The patient tolerated the procedure well and without complications. The patient was able to be extubated and transferred to the recovery unit in satisfactory condition.

## 2017-10-28 NOTE — Progress Notes (Signed)
Inpatient Diabetes Program Recommendations  AACE/ADA: New Consensus Statement on Inpatient Glycemic Control (2015)  Target Ranges:  Prepandial:   less than 140 mg/dL      Peak postprandial:   less than 180 mg/dL (1-2 hours)      Critically ill patients:  140 - 180 mg/dL   Lab Results  Component Value Date   GLUCAP 190 (H) 10/28/2017   HGBA1C 7.9 (H) 10/21/2017    Review of Glycemic Control  Diabetes history: DM1 Outpatient Diabetes medications: Basaglar 36 units QAM, Novolog 4-12 units tidwc Current orders for Inpatient glycemic control: Novolog 0-20 units Q4H.  Pt states he had iinsulin pump approx 2 years ago, discontinued d/t high cost of supplies.  Inpatient Diabetes Program Recommendations:     Add Lantus 30 units QAM (starting 7/20) Change Novolog to 0-9 units Q4H, then tidwc and hs Add Novolog 4 units tidwc for meal coverage insulin  Discussed above with pt and RN. Pt has Basaglar pen in room although he understands [facility] policy of not using home meds.  Will follow.  Thank you. Lorenda Peck, RD, LDN, CDE Inpatient Diabetes Coordinator [phone]

## 2017-10-29 LAB — GLUCOSE, CAPILLARY
Glucose-Capillary: 283 mg/dL — ABNORMAL HIGH (ref 70–99)
Glucose-Capillary: 256 mg/dL — ABNORMAL HIGH (ref 70–99)
Glucose-Capillary: 295 mg/dL — ABNORMAL HIGH (ref 70–99)

## 2017-10-29 LAB — BASIC METABOLIC PANEL
Anion gap: 8 (ref 5–15)
BUN: 23 mg/dL — ABNORMAL HIGH (ref 6–20)
CO2: 25 mmol/L (ref 22–32)
Calcium: 8.8 mg/dL — ABNORMAL LOW (ref 8.9–10.3)
Chloride: 101 mmol/L (ref 98–111)
Creatinine, Ser: 1.32 mg/dL — ABNORMAL HIGH (ref 0.61–1.24)
GFR calc Af Amer: >60 mL/min (ref >60)
GFR calc non Af Amer: 59 mL/min — ABNORMAL LOW (ref >60)
Glucose, Bld: 333 mg/dL — ABNORMAL HIGH (ref 70–99)
Potassium: 4.8 mmol/L (ref 3.5–5.1)
Sodium: 134 mmol/L — ABNORMAL LOW (ref 135–145)

## 2017-10-29 LAB — HEMOGLOBIN AND HEMATOCRIT, BLOOD
HCT: 37.3 % — ABNORMAL LOW (ref 39.0–52.0)
Hemoglobin: 12.6 g/dL — ABNORMAL LOW (ref 13.0–17.0)

## 2017-10-29 MED ORDER — INSULIN ASPART 100 UNIT/ML ~~LOC~~ SOLN: 0.0000 [IU] | Freq: Three times a day (TID) | SUBCUTANEOUS | Status: DC

## 2017-10-29 NOTE — Discharge Summary (Signed)
Physician Discharge Summary  Patient ID: [name] [name] MRN: [id number] DOB/AGE: Jul 09, 1960 57 y.o.  Admit date: 10/28/2017 Discharge date: 10/29/2017  Admission Diagnoses:  Discharge Diagnoses:  Active Problems:   Renal mass   Discharged Condition: good  Hospital Course: Patient underwent a robotic partial left nephrectomy yesterday.  He tolerated the procedure well and was stable postoperatively.  The following morning, for the first half of the shift, his JP only put out about 10 cc.  Foley catheter and JP were removed.  After ambulating, he was discharged home in stable condition.  He was tolerating a general diet and voiding.  Consults: None  Significant Diagnostic Studies: None  Treatments: surgery: As above  Discharge Exam: Blood pressure (!) 143/70, pulse 77, temperature 98.5 F (36.9 C), temperature source Oral, resp. rate 20, height 5\' 10"  (1.778 m), weight 72.6 kg (160 lb), SpO2 98 %. General appearance: alert no acute distress Adequate perfusion of extremities Nonlabored respiration Foley catheter with clear yellow urine JP with small amount of serosanguineous fluid Vision is clean dry and intact.  Abdomen soft nontender nondistended  Disposition:   Discharge Instructions    Discharge instructions   Complete by:  As directed    Activity:  You are encouraged to ambulate frequently (about every hour during waking hours) to help prevent blood clots from forming in your legs or lungs.  However, you should not engage in any heavy lifting (> 10-15 lbs), strenuous activity, or straining. Diet: You should advance your diet as instructed by your physician.  It will be normal to have some bloating, nausea, and abdominal discomfort intermittently. Prescriptions:  You will be provided a prescription for pain medication to take as needed.  If your pain is not severe enough to require the prescription pain medication, you may take extra strength Tylenol instead which will  have less side effects.  You should also take a prescribed stool softener to avoid straining with bowel movements as the prescription pain medication may constipate you. Incisions: You may remove your dressing bandages 48 hours after surgery if not removed in the hospital.  You will either have some small staples or special tissue glue at each of the incision sites. Once the bandages are removed (if present), the incisions may stay open to air.  You may start showering (but not soaking or bathing in water) the 2nd day after surgery and the incisions simply need to be patted dry after the shower.  No additional care is needed. What to call us about: You should call the office [phone]) if you develop fever > 101 or develop persistent vomiting.     Allergies as of 10/29/2017      Reactions   Cardizem [diltiazem Hcl] Other (See Comments)   Flushing and chest pressure   Carvedilol Hives, Itching   Levofloxacin Other (See Comments)   stomach upset      Medication List    STOP taking these medications   aspirin 81 MG tablet     TAKE these medications   albuterol 108 (90 Base) MCG/ACT inhaler Commonly known as:  PROVENTIL HFA;VENTOLIN HFA Inhale 2 puffs into the lungs every 4 (four) hours as needed. What changed:  reasons to take this   amLODipine 10 MG tablet Commonly known as:  NORVASC TAKE 1 TABLET BY MOUTH EVERY DAY   ASMANEX 60 METERED DOSES 220 MCG/INH inhaler Generic drug:  mometasone INHALE 2 PUFFS INTO THE LUNGS DAILY. What changed:  See the new instructions.   atorvastatin  20 MG tablet Commonly known as:  LIPITOR TAKE 1 TABLET EVERY DAY AT 6 PM   BASAGLAR KWIKPEN 100 UNIT/ML Sopn Inject 38 Units into the skin daily.   GLUCAGON EMERGENCY 1 MG injection Generic drug:  glucagon Inject 1 mg into the vein once as needed (for low BS).   insulin aspart 100 UNIT/ML injection Commonly known as:  novoLOG Inject 4-12 Units into the skin 3 (three) times daily with meals.  Per sliding scale 1 unit per 6 carbs   losartan 50 MG tablet Commonly known as:  COZAAR Take 1 tablet (50 mg total) by mouth daily. Please keep upcoming appt for future refills. Thank you   nebivolol 2.5 MG tablet Commonly known as:  BYSTOLIC Take 1 tablet (2.5 mg total) by mouth daily.   nitroGLYCERIN 0.4 MG SL tablet Commonly known as:  NITROSTAT Place 1 tablet (0.4 mg total) under the tongue every 5 (five) minutes as needed for chest pain.   traMADol 50 MG tablet Commonly known as:  ULTRAM Take 1-2 tablets (50-100 mg total) by mouth every 6 (six) hours as needed for moderate pain or severe pain.      Follow-up Information    [name] Hughs, MD On 11/07/2017.   Specialty:  Urology Why:  at 11:00 Contact information: [place]  [id number] [phone]           Signed: [name], [name] 10/29/2017, 10:58 AM

## 2017-11-11 IMAGING — MR MR ABDOMEN WO/W CM
9 of 18 series · 21 of 48 positions shown · IV contrast (multihance)
Comparison: CT 04/20/2006, renal ultrasound 07/17/2014 and MRI
08/30/2014.

CLINICAL DATA: Follow up indeterminate left renal lesion.

EXAM:
MRI ABDOMEN WITHOUT AND WITH CONTRAST
TECHNIQUE: Multiplanar multisequence MR imaging of the abdomen was performed
both before and after the administration of intravenous contrast.
CONTRAST:  15mL MULTIHANCE GADOBENATE DIMEGLUMINE 529 MG/ML IV SOLN

[Series 3: DWI b500 · axial · 6.0mm · 1.48mm/px · z∈[-118,+139]mm · 3 of 68 slices shown]
[im 1/68]
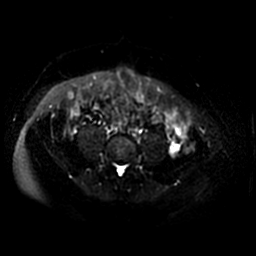
[im 34/68]
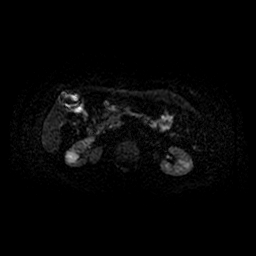
[im 68/68]
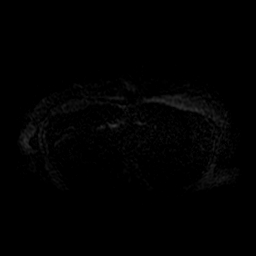

[Series 4: T2 fat-sat · axial · 5.0mm · 0.78mm/px · z∈[-101,+144]mm · 2 of 50 slices shown]
[im 1/50]
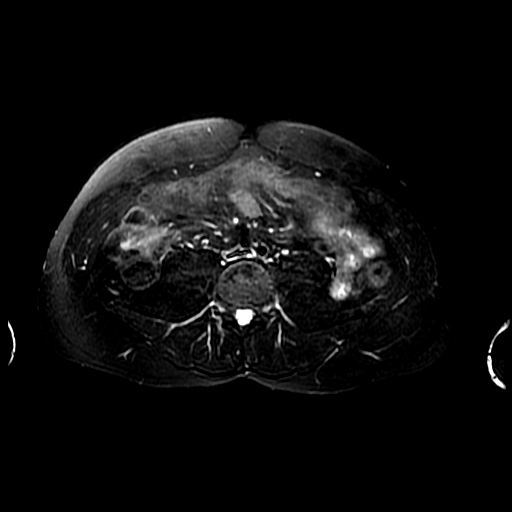
[im 50/50]
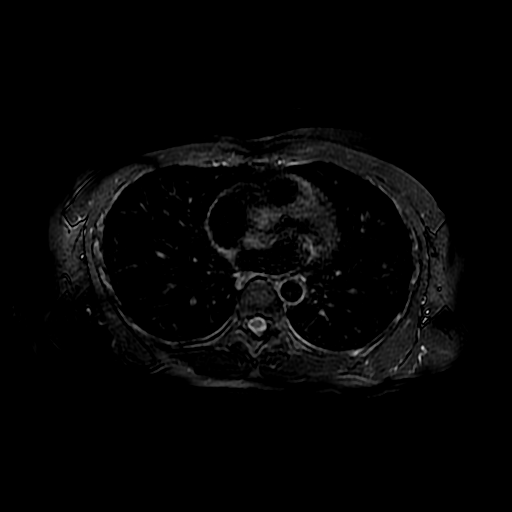

[Series 5: ax dualecho · axial · 5.0mm · 0.78mm/px · z∈[-101,+144]mm · 4 of 100 slices shown]
[im 1/100]
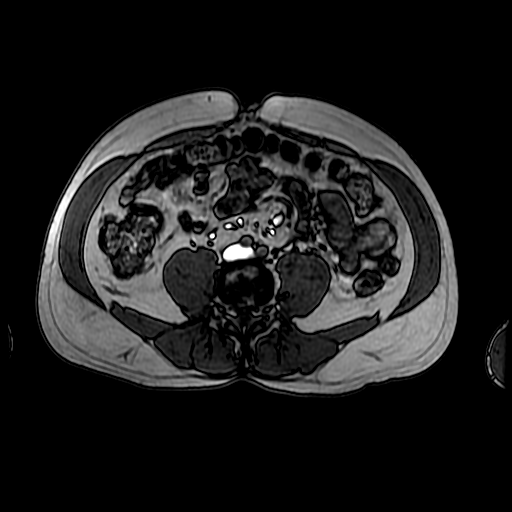
[im 34/100]
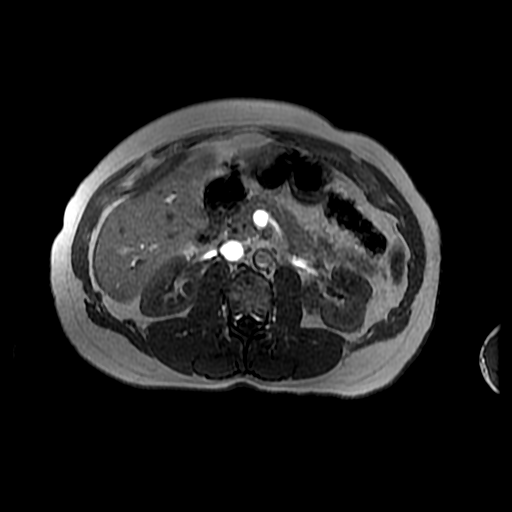
[im 67/100]
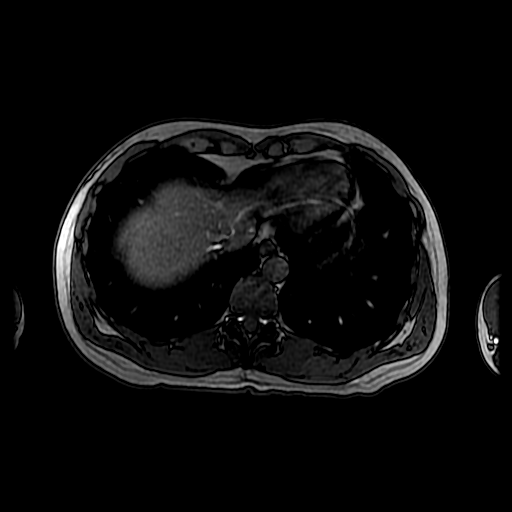
[im 100/100]
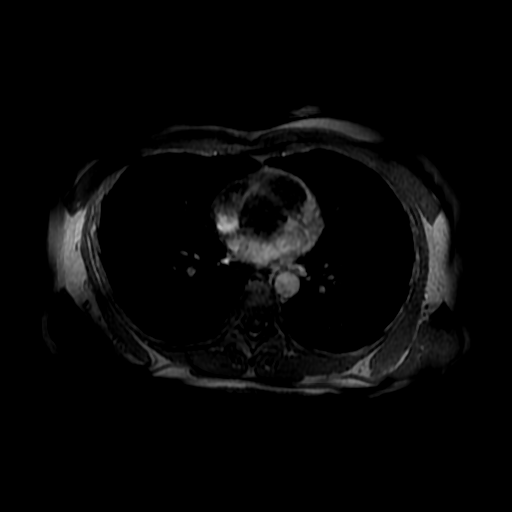

[Series 6: T2 · axial · 5.0mm · 0.78mm/px · z∈[-91,+144]mm · 2 of 48 slices shown (1 of 2)]
[im 1/48]
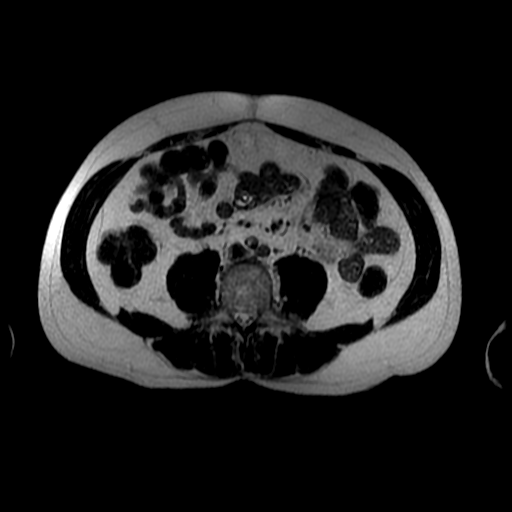
[im 48/48]
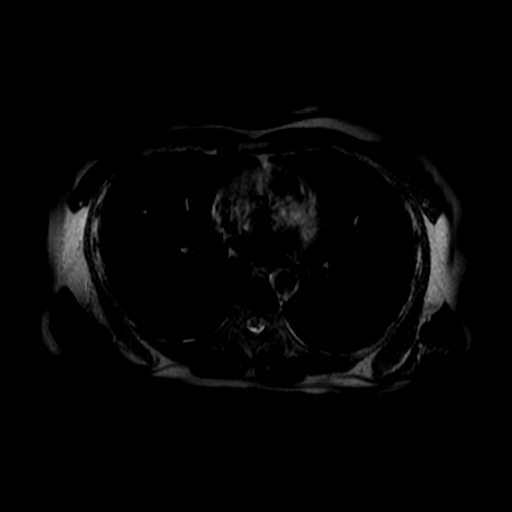

[Series 7: T2 · coronal · 5.0mm · 0.78mm/px · 1 of 40 slices shown (2 of 2)]
[im 1/40]
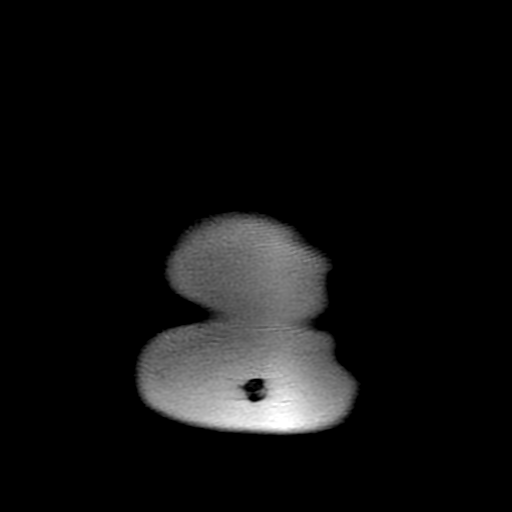

[Series 8: bSSFP · axial · 5.0mm · 0.78mm/px · z∈[-101,+144]mm · 2 of 50 slices shown]
[im 1/50]
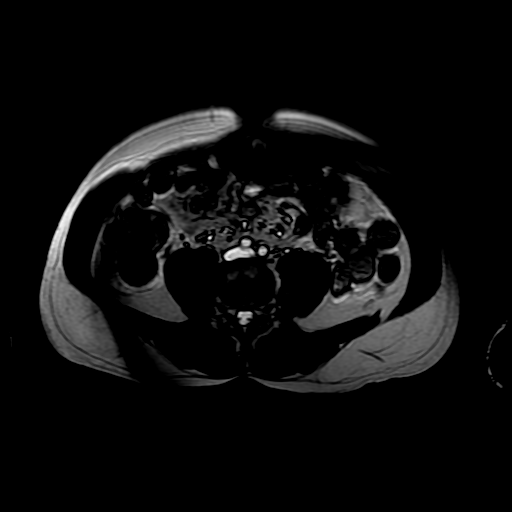
[im 50/50]
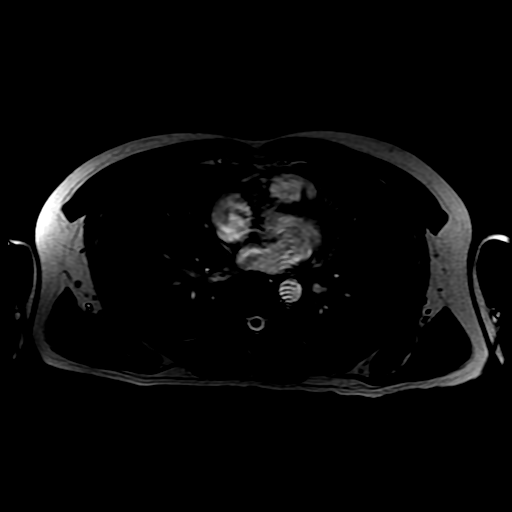

[Series 300: DWI · axial · 6.0mm · 1.48mm/px · 1 of 34 slices shown]
[im 1/34]
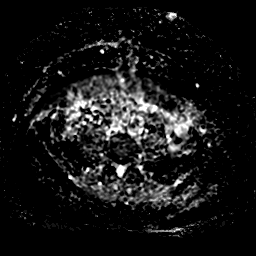

[Series 900: T1 dynamic · axial · 5.0mm · 0.78mm/px · z∈[-94,+123]mm · 3 of 88 slices shown (1 of 2)]
[im 1/88]
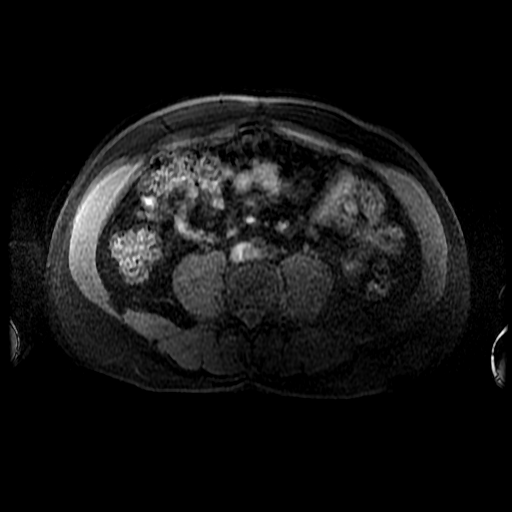
[im 44/88]
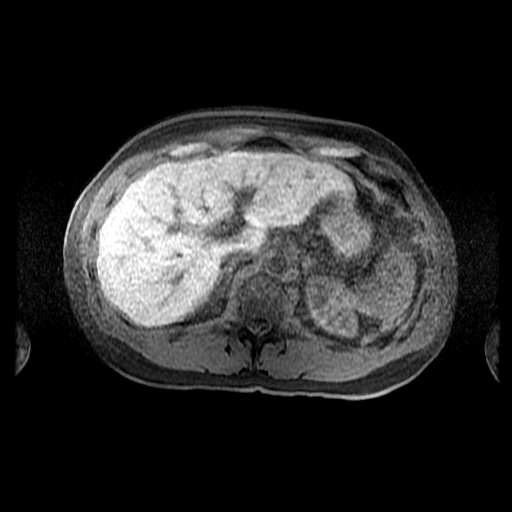
[im 88/88]
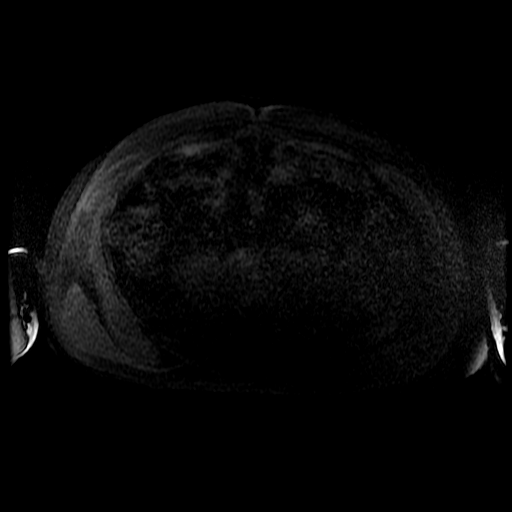

[Series 901: T1 dynamic · axial · 5.0mm · 0.78mm/px · z∈[-94,+123]mm · 3 of 88 slices shown (2 of 2)]
[im 1/88]
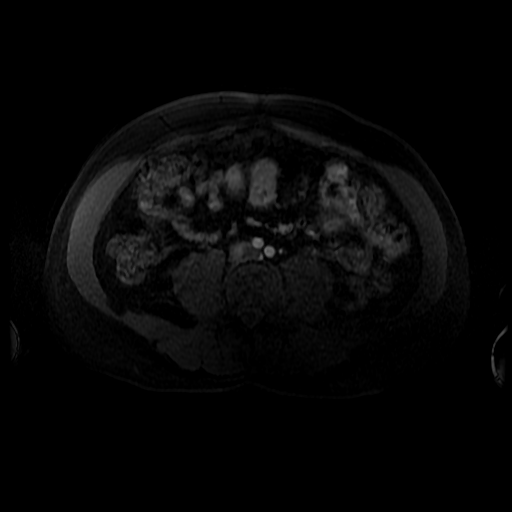
[im 44/88]
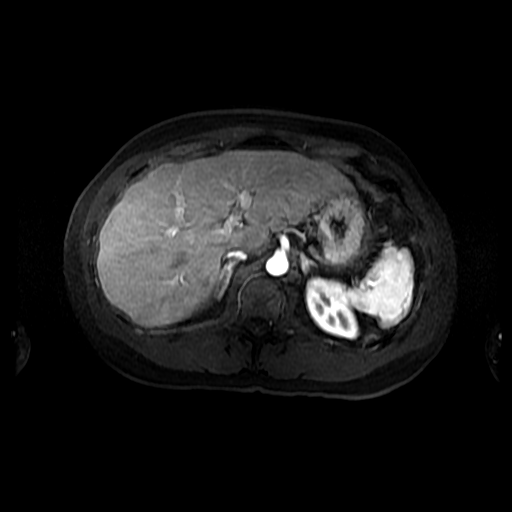
[im 88/88]
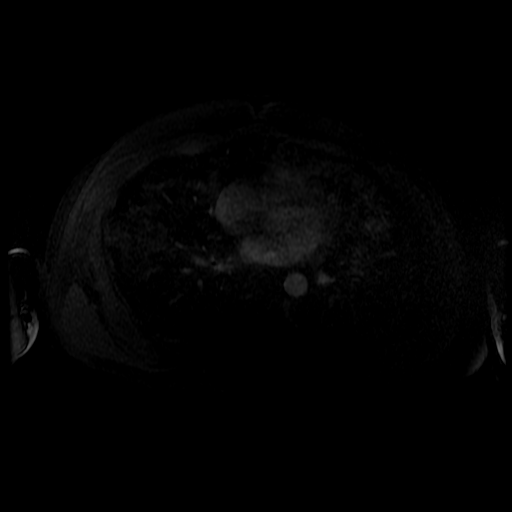

[21 of 48 positions shown; findings below may reference images not displayed]

FINDINGS: Lower chest:  Unremarkable.

Hepatobiliary: The liver appears stable without focal abnormality or
abnormal enhancement. No evidence of gallstones, gallbladder wall
thickening or biliary dilatation.

Pancreas: There is a stable nonenhancing 9 x 6 mm cystic lesion in
the uncinate process of the pancreas, best seen on image 31 of
series 4. This demonstrates no communication with the pancreatic
duct which is normal in caliber. The pancreas otherwise appears
normal.

Spleen: Normal in size without focal abnormality.

Adrenals/Urinary Tract: Both adrenal glands appear normal. There is
a stable simple cyst in the interpolar region of the right kidney,
measuring 2.0 x 2.4 cm. There is a small simple cyst in the upper
pole of the left kidney, measuring 7 mm on image 20 of series 4. The
lesion of concern posteriorly in the upper pole of the left kidney
demonstrates low T2 signal, measuring 1.4 x 0.9 cm on image 20 of
series 4. There is no intrinsic T1 shortening. Post-contrast, this
lesion demonstrates low level enhancement, best seen on the
subtracted images. On the postcontrast images, the lesion measures
13 x 9 mm. Previously, this lesion measured 8 x 6 mm. No other
suspicious renal findings. No hydronephrosis.

Stomach/Bowel: No evidence of bowel wall thickening, distention or
surrounding inflammatory change.

Vascular/Lymphatic: There are no enlarged abdominal lymph nodes. No
significant vascular findings are present.

Other: None.

Musculoskeletal: No acute or significant osseous findings. Scattered
probable hemangiomas in the spine.
IMPRESSION: 1. The lesion of concern posteriorly in the upper pole of the left
kidney has mildly enlarged compared with the MRI from 1 year ago.
This lesion demonstrates low T2 signal and low grade enhancement,
suspicious for papillary renal cell carcinoma.
2. Stable bilateral simple renal cysts (Bosniak 1).
3. Stable cystic lesion within the uncinate process of the pancreas,
like postinflammatory or other incidental indolent lesion.
4. No evidence of metastatic disease.

## 2017-11-16 ENCOUNTER — Other Ambulatory Visit: Payer: Self-pay | Admitting: Cardiology

## 2017-12-01 ENCOUNTER — Other Ambulatory Visit: Payer: Self-pay | Admitting: Pulmonary Disease

## 2017-12-29 ENCOUNTER — Other Ambulatory Visit: Payer: Self-pay | Admitting: Cardiology

## 2018-01-06 ENCOUNTER — Other Ambulatory Visit: Payer: Self-pay | Admitting: Cardiology

## 2018-05-02 ENCOUNTER — Ambulatory Visit (HOSPITAL_COMMUNITY)
Admission: RE | Admit: 2018-05-02 | Discharge: 2018-05-02 | Disposition: A | Discharge status: Home / Self Care | Payer: BC Managed Care – PPO | Source: Ambulatory Visit | Attending: Urology | Admitting: Urology

## 2018-05-02 ENCOUNTER — Other Ambulatory Visit (HOSPITAL_COMMUNITY): Payer: Self-pay | Admitting: Urology

## 2018-05-02 DIAGNOSIS — C642 Malignant neoplasm of left kidney, except renal pelvis: Secondary | ICD-10-CM | POA: Insufficient documentation

## 2018-06-19 ENCOUNTER — Other Ambulatory Visit: Payer: Self-pay

## 2018-06-19 MED ORDER — LOSARTAN POTASSIUM 50 MG PO TABS: 50.0000 mg | ORAL_TABLET | Freq: Every day | ORAL | 0 refills | Status: DC

## 2018-06-23 ENCOUNTER — Telehealth: Payer: Self-pay

## 2018-06-23 NOTE — Telephone Encounter (Signed)
Received information from [facility] that the patient's losartan is on backorder.

## 2018-06-25 NOTE — Telephone Encounter (Signed)
[facility] to see if they have it

## 2018-06-26 MED ORDER — LOSARTAN POTASSIUM 50 MG PO TABS: 50.0000 mg | ORAL_TABLET | Freq: Every day | ORAL | 3 refills | Status: DC

## 2018-06-28 NOTE — Telephone Encounter (Signed)
[facility] has losartan. I left a message for the patient to call back.

## 2018-08-31 ENCOUNTER — Other Ambulatory Visit: Payer: Self-pay | Admitting: Cardiology

## 2018-09-28 ENCOUNTER — Other Ambulatory Visit: Payer: Self-pay | Admitting: Cardiology

## 2018-10-11 ENCOUNTER — Other Ambulatory Visit: Payer: Self-pay | Admitting: Cardiology

## 2018-10-21 ENCOUNTER — Other Ambulatory Visit: Payer: Self-pay | Admitting: Cardiology

## 2018-11-04 ENCOUNTER — Other Ambulatory Visit: Payer: Self-pay | Admitting: Cardiology

## 2018-11-06 ENCOUNTER — Other Ambulatory Visit: Payer: Self-pay

## 2018-11-06 ENCOUNTER — Encounter: Payer: Self-pay | Admitting: Cardiology

## 2018-11-06 ENCOUNTER — Ambulatory Visit: Payer: BC Managed Care – PPO | Admitting: Cardiology

## 2018-11-06 VITALS — BP 138/74 | HR 71 | Ht 70.0 in | Wt 167.8 lb

## 2018-11-06 DIAGNOSIS — I2510 Atherosclerotic heart disease of native coronary artery without angina pectoris: Secondary | ICD-10-CM | POA: Diagnosis not present

## 2018-11-06 DIAGNOSIS — I10 Essential (primary) hypertension: Secondary | ICD-10-CM | POA: Diagnosis not present

## 2018-11-06 DIAGNOSIS — Z8679 Personal history of other diseases of the circulatory system: Secondary | ICD-10-CM

## 2018-11-06 DIAGNOSIS — E785 Hyperlipidemia, unspecified: Secondary | ICD-10-CM | POA: Diagnosis not present

## 2018-11-06 MED ORDER — AMLODIPINE BESYLATE 10 MG PO TABS: 10.0000 mg | ORAL_TABLET | Freq: Every day | ORAL | 3 refills | Status: DC

## 2018-11-06 NOTE — Progress Notes (Signed)
Cardiology Office Note:    Date:  11/06/2018   ID:  [name] [name], DOB 1960/09/08, MRN [id number]  PCP:  Hulan Fess, MD  Cardiologist:  No primary care provider on file.    Referring MD: Hulan Fess, MD   Chief Complaint  Patient presents with  . Coronary Artery Disease  . Hyperlipidemia    History of Present Illness:    [name] [name] is a 58 y.o. male with a hx of nonobstructive ASCADwith 40-50% LAD, HTN, h/o pericarditis with pericardial effusion (resolved with NSAIDS) anddyslipidemia.  he is here today for followup and is doing well.  he denies any chest pain or pressure, SOB, DOE, PND, orthopnea, LE edema, dizziness, palpitations or syncope. He is compliant with his meds and is tolerating meds with no SE.    Past Medical History:  Diagnosis Date  . Allergic reaction caused by a drug 03/07/2013  . Asthma    uses asmanex and albuterol  . Coronary artery disease 07/17/2004   nonobstructive ASCAD with 40-50% LAD  . Dyslipidemia   . Hyperlipidemia   . Hypertension   . Nephrolithiasis   . Pericarditis 2016   resolved on followup echo  . Type I diabetes mellitus ([place])    ; followed by Dr. Sherrie George    Past Surgical History:  Procedure Laterality Date  . BACK SURGERY  2013   LOWER   . CARDIAC CATHETERIZATION    . COLONSCOPY AND ENDOSCOPY  03/2017  . LITHOTRIPSY  YRS AGO X 1  . ORCHIECTOMY Right 12/14/2013   Procedure: RIGHT RADICAL ORCHIECTOMY WITH PLACEMENT OF TESTICULAR PROTHESIS;  Surgeon: Ardis Hughs, MD;  Location: [facility];  Service: Urology;  Laterality: Right;  . ROBOTIC ASSITED PARTIAL NEPHRECTOMY Left 10/28/2017   Procedure: XI ROBOTIC ASSITED PARTIAL LEFT NEPHRECTOMY;  Surgeon: Ardis Hughs, MD;  Location: [facility];  Service: Urology;  Laterality: Left;    Current Medications: Current Meds  Medication Sig  . albuterol (PROVENTIL HFA;VENTOLIN HFA) 108 (90 Base) MCG/ACT inhaler Inhale 2 puffs into the  lungs every 4 (four) hours as needed.  Marland Kitchen amLODipine (NORVASC) 10 MG tablet Take 1 tablet (10 mg total) by mouth daily.  Marland Kitchen atorvastatin (LIPITOR) 20 MG tablet Take 1 tablet (20 mg total) by mouth daily at 6 PM. Please make annual appt with Dr. Radford Pax for future refills. Thank you. 1st attempt.  Marland Kitchen glucagon (GLUCAGON EMERGENCY) 1 MG injection Inject 1 mg into the vein once as needed (for low BS).   Marland Kitchen insulin aspart (NOVOLOG) 100 UNIT/ML injection Inject 4-12 Units into the skin 3 (three) times daily with meals. Per sliding scale 1 unit per 6 carbs  . Insulin Glargine (BASAGLAR KWIKPEN) 100 UNIT/ML SOPN Inject 38 Units into the skin daily.  Marland Kitchen losartan (COZAAR) 50 MG tablet Take 1 tablet (50 mg total) by mouth daily.  . nebivolol (BYSTOLIC) 2.5 MG tablet Take 1 tablet (2.5 mg total) by mouth daily. Please keep upcoming appt with Dr. Radford Pax for July for future refills. Thank you  . nitroGLYCERIN (NITROSTAT) 0.4 MG SL tablet Place 1 tablet (0.4 mg total) under the tongue every 5 (five) minutes as needed for chest pain.  . [DISCONTINUED] amLODipine (NORVASC) 10 MG tablet Take 1 tablet (10 mg total) by mouth daily. Please call and schedule an appointment for further refills     Allergies:   Cardizem [diltiazem hcl], Carvedilol, and Levofloxacin   Social History   Socioeconomic History  . Marital status:  Married    Spouse name: Not on file  . Number of children: Not on file  . Years of education: Not on file  . Highest education level: Not on file  Occupational History  . Occupation: Pharmacist, hospital  Social Needs  . Financial resource strain: Not on file  . Food insecurity    Worry: Not on file    Inability: Not on file  . Transportation needs    Medical: Not on file    Non-medical: Not on file  Tobacco Use  . Smoking status: Never Smoker  . Smokeless tobacco: Never Used  Substance and Sexual Activity  . Alcohol use: Yes    Alcohol/week: 3.0 standard drinks    Types: 3 Cans of beer per week     Comment: 3 x weekly  . Drug use: No  . Sexual activity: Not on file  Lifestyle  . Physical activity    Days per week: Not on file    Minutes per session: Not on file  . Stress: Not on file  Relationships  . Social Herbalist on phone: Not on file    Gets together: Not on file    Attends religious service: Not on file    Active member of club or organization: Not on file    Attends meetings of clubs or organizations: Not on file    Relationship status: Not on file  Other Topics Concern  . Not on file  Social History Narrative  . Not on file     Family History: The patient's family history includes Cancer in his maternal aunt and maternal uncle; Coronary artery disease in his father; Diabetes in his brother, father, and paternal grandmother; Hypertension in his father; Hypothyroidism in his sister; Lung cancer in his mother.  ROS:   Please see the history of present illness.    ROS  All other systems reviewed and negative.   EKGs/Labs/Other Studies Reviewed:    The following studies were reviewed today: none  EKG:  EKG is  ordered today.  The ekg ordered today demonstrates NSR at 71bpm with no ST chnages  Recent Labs: No results found for requested labs within last 8760 hours.   Recent Lipid Panel    Component Value Date/Time   CHOL 138 11/17/2016 0830   CHOL 126 02/01/2014 0823   TRIG 67 11/17/2016 0830   TRIG 55 02/01/2014 0823   HDL 49 11/17/2016 0830   HDL 68 02/01/2014 0823   CHOLHDL 2.8 11/17/2016 0830   CHOLHDL 2.0 09/18/2015 0814   VLDL 8 09/18/2015 0814   LDLCALC 76 11/17/2016 0830   LDLCALC 47 02/01/2014 0823    Physical Exam:    VS:  BP 138/74   Pulse 71   Ht 5\' 10"  (1.778 m)   Wt 167 lb 12.8 oz (76.1 kg)   BMI 24.08 kg/m     Wt Readings from Last 3 Encounters:  11/06/18 167 lb 12.8 oz (76.1 kg)  10/28/17 160 lb (72.6 kg)  10/21/17 160 lb (72.6 kg)     GEN:  Well nourished, well developed in no acute distress HEENT: Normal  NECK: No JVD; No carotid bruits LYMPHATICS: No lymphadenopathy CARDIAC: RRR, no murmurs, rubs, gallops RESPIRATORY:  Clear to auscultation without rales, wheezing or rhonchi  ABDOMEN: Soft, non-tender, non-distended MUSCULOSKELETAL:  No edema; No deformity  SKIN: Warm and dry NEUROLOGIC:  Alert and oriented x 3 PSYCHIATRIC:  Normal affect   ASSESSMENT:    1. Coronary artery  disease involving native coronary artery of native heart without angina pectoris   2. Essential hypertension   3. History of pericarditis   4. Dyslipidemia    PLAN:    In order of problems listed above:  1.  ASCAD -nonobstructive by cath -he has not had any anginal sx -he will continue on ASA 81mg  daily, BB and statin  2.  Pericardial effusion/pericarditis -he has not had any further CP -repeat echo to make sure no pericardial effusion  3.  Hypertension -his BP is controlled on exam today.  He will continue on Bystolic 2.5mg  daily, amlodipine 10mg  daily and Losartan 50mg  daily.  -his creatinine was 0.9 on 04/2018.  4.  Hyperlipidemia  his LDL goal is < 70.  -his last LDL was 64 a year ago -I will repeat FLP and ALT -he will continue on atorvastatin 20mg  daily   Medication Adjustments/Labs and Tests Ordered: Current medicines are reviewed at length with the patient today.  Concerns regarding medicines are outlined above.  Orders Placed This Encounter  Procedures  . Lipid panel  . ALT  . EKG 12-Lead  . ECHOCARDIOGRAM COMPLETE   Meds ordered this encounter  Medications  . amLODipine (NORVASC) 10 MG tablet    Sig: Take 1 tablet (10 mg total) by mouth daily.    Dispense:  90 tablet    Refill:  3    Signed, Fransico Him, MD  11/06/2018 10:01 AM    [place]

## 2018-11-06 NOTE — Patient Instructions (Signed)
Medication Instructions:  Your provider recommends that you continue on your current medications as directed. Please refer to the Current Medication list given to you today.    Labwork: Please come fasting to your echocardiogram. We will check your cholesterol that day.  Testing/Procedures: Your provider has requested that you have an echocardiogram. Echocardiography is a painless test that uses sound waves to create images of your heart. It provides your doctor with information about the size and shape of your heart and how well your heart's chambers and valves are working. This procedure takes approximately one hour. There are no restrictions for this procedure.  Follow-Up: Your provider wants you to follow-up in: 1 year with Dr. Radford Pax. You will receive a reminder letter in the mail two months in advance. If you don't receive a letter, please call our office to schedule the follow-up appointment.

## 2018-11-10 ENCOUNTER — Ambulatory Visit (HOSPITAL_COMMUNITY): Payer: BC Managed Care – PPO | Attending: Cardiology

## 2018-11-10 ENCOUNTER — Other Ambulatory Visit: Payer: Self-pay

## 2018-11-10 ENCOUNTER — Other Ambulatory Visit: Payer: BC Managed Care – PPO | Admitting: *Deleted

## 2018-11-10 DIAGNOSIS — I2510 Atherosclerotic heart disease of native coronary artery without angina pectoris: Secondary | ICD-10-CM | POA: Insufficient documentation

## 2018-11-10 DIAGNOSIS — E785 Hyperlipidemia, unspecified: Secondary | ICD-10-CM

## 2018-11-10 DIAGNOSIS — Z8679 Personal history of other diseases of the circulatory system: Secondary | ICD-10-CM | POA: Diagnosis not present

## 2018-11-10 LAB — LIPID PANEL
Chol/HDL Ratio: 2.9 ratio (ref 0.0–5.0)
Cholesterol, Total: 147 mg/dL (ref 100–199)
HDL: 51 mg/dL (ref >39)
LDL Calculated: 79 mg/dL (ref 0–99)
Triglycerides: 87 mg/dL (ref 0–149)
VLDL Cholesterol Cal: 17 mg/dL (ref 5–40)

## 2018-11-10 LAB — ALT: ALT: 24 IU/L (ref 0–44)

## 2018-11-13 ENCOUNTER — Telehealth: Payer: Self-pay | Admitting: *Deleted

## 2018-11-13 DIAGNOSIS — E785 Hyperlipidemia, unspecified: Secondary | ICD-10-CM

## 2018-11-13 DIAGNOSIS — I2510 Atherosclerotic heart disease of native coronary artery without angina pectoris: Secondary | ICD-10-CM

## 2018-11-13 MED ORDER — ATORVASTATIN CALCIUM 40 MG PO TABS: 40.0000 mg | ORAL_TABLET | Freq: Every day | ORAL | 3 refills | Status: DC

## 2018-11-13 NOTE — Telephone Encounter (Signed)
Pt has been notified of lab results and recommendations. Pt is agreeable to plan of care to increase Atorvastatin to 40 mg daily, pt will double up on the 20 mg tablets and will call when he is ready for the new Rx to be sent in. Labs scheduled for 9/18. Pt thanked me for the call. Patient notified of result.  Please refer to phone note from today for complete details.   Julaine Hua, [place] 11/13/2018 1:29 PM

## 2018-11-13 NOTE — Telephone Encounter (Signed)
-----   Message from Sueanne Margarita, MD sent at 11/10/2018  4:51 PM EDT ----- LDL is not at goal - increase atorvastatin to 40mg  daily and repeat FLP and ALT in 6 weeks

## 2018-11-20 ENCOUNTER — Other Ambulatory Visit: Payer: Self-pay | Admitting: Cardiology

## 2018-12-29 ENCOUNTER — Other Ambulatory Visit: Payer: Self-pay

## 2018-12-29 ENCOUNTER — Other Ambulatory Visit: Payer: BC Managed Care – PPO | Admitting: *Deleted

## 2018-12-29 DIAGNOSIS — I2510 Atherosclerotic heart disease of native coronary artery without angina pectoris: Secondary | ICD-10-CM

## 2018-12-29 DIAGNOSIS — E785 Hyperlipidemia, unspecified: Secondary | ICD-10-CM

## 2018-12-29 LAB — LIPID PANEL
Chol/HDL Ratio: 3.1 ratio (ref 0.0–5.0)
Cholesterol, Total: 150 mg/dL (ref 100–199)
HDL: 48 mg/dL (ref >39)
LDL Chol Calc (NIH): 83 mg/dL (ref 0–99)
Triglycerides: 102 mg/dL (ref 0–149)
VLDL Cholesterol Cal: 19 mg/dL (ref 5–40)

## 2018-12-29 LAB — ALT: ALT: 27 IU/L (ref 0–44)

## 2019-01-03 ENCOUNTER — Telehealth: Payer: Self-pay | Admitting: Cardiology

## 2019-01-03 DIAGNOSIS — I2510 Atherosclerotic heart disease of native coronary artery without angina pectoris: Secondary | ICD-10-CM

## 2019-01-03 DIAGNOSIS — Z79899 Other long term (current) drug therapy: Secondary | ICD-10-CM

## 2019-01-03 DIAGNOSIS — E785 Hyperlipidemia, unspecified: Secondary | ICD-10-CM

## 2019-01-03 NOTE — Telephone Encounter (Signed)
Follow Up:    Pt is returning Pam's call from yesterday, concerning his lab results.

## 2019-01-03 NOTE — Telephone Encounter (Signed)
Notes recorded by Sueanne Margarita, MD on 01/02/2019 at 9:16 AM EDT  LDL not at goal - increase ATorvastatin to 80mg  daily and repeat FLP and ALT in 6 weeks    Informed patient of lab results as written above. Patient was not agreeable to this plan. Patient stated when atorvastatin was increased from 20 mg to 40 mg that he started having terrible side effects (edema in ankles and feet, joint pain, and elevated glucose levels). Informed patient that we have a lipid clinic available. If he was interested, we could see if Dr. Radford Pax would refer him. Patient said he would be agreeable to this. Will forward to Dr. Radford Pax for advisement.[name] [name]

## 2019-01-03 NOTE — Telephone Encounter (Signed)
Refer to lipid clinic 

## 2019-01-03 NOTE — Telephone Encounter (Signed)
Left message on pts VM per his DPR that I am putting in referral to the [facility].

## 2019-03-16 ENCOUNTER — Encounter: Payer: Self-pay | Admitting: Internal Medicine

## 2019-03-16 ENCOUNTER — Ambulatory Visit: Payer: BC Managed Care – PPO | Admitting: Internal Medicine

## 2019-03-16 ENCOUNTER — Other Ambulatory Visit: Payer: Self-pay | Admitting: Cardiology

## 2019-03-16 ENCOUNTER — Other Ambulatory Visit: Payer: Self-pay

## 2019-03-16 VITALS — BP 142/74 | HR 76 | Temp 97.7°F | Ht 70.0 in | Wt 172.0 lb

## 2019-03-16 DIAGNOSIS — I2510 Atherosclerotic heart disease of native coronary artery without angina pectoris: Secondary | ICD-10-CM | POA: Diagnosis not present

## 2019-03-16 DIAGNOSIS — Z789 Other specified health status: Secondary | ICD-10-CM | POA: Diagnosis not present

## 2019-03-16 DIAGNOSIS — T466X5A Adverse effect of antihyperlipidemic and antiarteriosclerotic drugs, initial encounter: Secondary | ICD-10-CM

## 2019-03-16 DIAGNOSIS — M791 Myalgia, unspecified site: Secondary | ICD-10-CM | POA: Diagnosis not present

## 2019-03-16 DIAGNOSIS — E785 Hyperlipidemia, unspecified: Secondary | ICD-10-CM

## 2019-03-16 MED ORDER — EZETIMIBE-SIMVASTATIN 10-20 MG PO TABS: 1.0000 | ORAL_TABLET | Freq: Every day | ORAL | 3 refills | Status: DC

## 2019-03-16 NOTE — Patient Instructions (Signed)
Medication Instructions:  START vytorin 10/20mg  daily  *If you need a refill on your cardiac medications before your next appointment, please call your pharmacy*  Lab Work: FASTING lipid panel in 3 months  If you have labs (blood work) drawn today and your tests are completely normal, you will receive your results only by: Marland Kitchen MyChart Message (if you have MyChart) OR . A paper copy in the mail If you have any lab test that is abnormal or we need to change your treatment, we will call you to review the results.  Testing/Procedures: NONE  Follow-Up: At [facility], you and your health needs are our priority.  As part of our continuing mission to provide you with exceptional heart care, we have created designated Provider Care Teams.  These Care Teams include your primary Cardiologist (physician) and Advanced Practice Providers (APPs -  Physician Assistants and Nurse Practitioners) who all work together to provide you with the care you need, when you need it.  Your next appointment:   3 month(s) - lipid clinic  The format for your next appointment:   Either In Person or Virtual  Provider:   K. Mali Hilty, MD  Other Instructions

## 2019-03-18 ENCOUNTER — Encounter: Payer: Self-pay | Admitting: Internal Medicine

## 2019-03-18 NOTE — Progress Notes (Signed)
LIPID CLINIC CONSULT NOTE  Chief Complaint:  Manage dyslipidemia  Primary Care Physician: Hulan Fess, MD  Primary Cardiologist:  No primary care provider on file.  HPI:  [name] [name] is a 58 y.o. male who is being seen today for the evaluation of dyslipidemia at the request of Turner, Eber Hong, MD.  This is a pleasant 58 year old male kindly referred by Dr. Radford Pax for evaluation and management of dyslipidemia.  He has a history of coronary artery disease with a 40 to 50% LAD stenosis by cath in 2006.  His target LDL is less than 70 however most recently his labs showed total cholesterol 150, triglycerides 102, HDL 48 and LDL of 83.  He was on atorvastatin 40 mg daily which he said actually caused him some myalgias.  In fact he had previously cut back to 20 mg of atorvastatin because he felt that this was better tolerated.  It was advised that he increase his atorvastatin further to 80 mg however he did not do this because of concerns of myopathy.  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  At times he has cramping in his hands and some difficulty being able to do his job.  He also says that he had previously taken Crestor in the past but it also was not tolerated  PMHx:  Past Medical History:  Diagnosis Date  . Allergic reaction caused by a drug 03/07/2013  . Asthma    uses asmanex and albuterol  . Coronary artery disease 07/17/2004   nonobstructive ASCAD with 40-50% LAD  . Dyslipidemia   . Hyperlipidemia   . Hypertension   . Nephrolithiasis   . Pericarditis 2016   resolved on followup echo  . Type I diabetes mellitus ([place])    ; followed by Dr. Sherrie George    Past Surgical History:  Procedure Laterality Date  . BACK SURGERY  2013   LOWER   . CARDIAC CATHETERIZATION    . COLONSCOPY AND ENDOSCOPY  03/2017  . LITHOTRIPSY  YRS AGO X 1  . ORCHIECTOMY Right 12/14/2013   Procedure: RIGHT RADICAL ORCHIECTOMY WITH PLACEMENT OF TESTICULAR PROTHESIS;   Surgeon: Ardis Hughs, MD;  Location: [facility];  Service: Urology;  Laterality: Right;  . ROBOTIC ASSITED PARTIAL NEPHRECTOMY Left 10/28/2017   Procedure: XI ROBOTIC ASSITED PARTIAL LEFT NEPHRECTOMY;  Surgeon: Ardis Hughs, MD;  Location: [facility];  Service: Urology;  Laterality: Left;    FAMHx:  Family History  Problem Relation Age of Onset  . Coronary artery disease Father   . Diabetes Father   . Hypertension Father   . Lung cancer Mother   . Diabetes Brother   . Hypothyroidism Sister   . Diabetes Paternal Grandmother   . Cancer Maternal Aunt   . Cancer Maternal Uncle     SOCHx:   reports that he has never smoked. He has never used smokeless tobacco. He reports current alcohol use of about 3.0 standard drinks of alcohol per week. He reports that he does not use drugs.  ALLERGIES:  Allergies  Allergen Reactions  . Cardizem [Diltiazem Hcl] Other (See Comments)    Flushing and chest pressure  . Carvedilol Hives and Itching  . Levofloxacin Other (See Comments)    stomach upset    ROS: Pertinent items noted in HPI and remainder of comprehensive ROS otherwise negative.  HOME MEDS: Current Outpatient Medications on File Prior to Visit  Medication Sig Dispense Refill  .  albuterol (PROVENTIL HFA;VENTOLIN HFA) 108 (90 Base) MCG/ACT inhaler Inhale 2 puffs into the lungs every 4 (four) hours as needed. 1 Inhaler 6  . amLODipine (NORVASC) 10 MG tablet Take 1 tablet (10 mg total) by mouth daily. 90 tablet 3  . glucagon (GLUCAGON EMERGENCY) 1 MG injection Inject 1 mg into the vein once as needed (for low BS).     Marland Kitchen insulin aspart (NOVOLOG) 100 UNIT/ML injection Inject 4-12 Units into the skin 3 (three) times daily with meals. Per sliding scale 1 unit per 6 carbs    . Insulin Glargine (BASAGLAR KWIKPEN) 100 UNIT/ML SOPN Inject 38 Units into the skin daily.    Marland Kitchen losartan (COZAAR) 50 MG tablet Take 1 tablet (50 mg total) by mouth daily. 90 tablet 3  .  nitroGLYCERIN (NITROSTAT) 0.4 MG SL tablet Place 1 tablet (0.4 mg total) under the tongue every 5 (five) minutes as needed for chest pain. 25 tablet 1   No current facility-administered medications on file prior to visit.     LABS/IMAGING: No results found for this or any previous visit (from the past 48 hour(s)). No results found.  LIPID PANEL:    Component Value Date/Time   CHOL 150 12/29/2018 0819   CHOL 126 02/01/2014 0823   TRIG 102 12/29/2018 0819   TRIG 55 02/01/2014 0823   HDL 48 12/29/2018 0819   HDL 68 02/01/2014 0823   CHOLHDL 3.1 12/29/2018 0819   CHOLHDL 2.0 09/18/2015 0814   VLDL 8 09/18/2015 0814   LDLCALC 83 12/29/2018 0819   LDLCALC 47 02/01/2014 0823    WEIGHTS: Wt Readings from Last 3 Encounters:  03/16/19 172 lb (78 kg)  11/06/18 167 lb 12.8 oz (76.1 kg)  10/28/17 160 lb (72.6 kg)    VITALS: BP (!) 142/74   Pulse 76   Temp 97.7 F (36.5 C)   Ht 5\' 10"  (1.778 m)   Wt 172 lb (78 kg)   SpO2 97%   BMI 24.68 kg/m   EXAM: General appearance: alert and no distress Neck: no carotid bruit, no JVD and thyroid not enlarged, symmetric, no tenderness/mass/nodules Lungs: clear to auscultation bilaterally Heart: regular rate and rhythm, S1, S2 normal, no murmur, click, rub or gallop Abdomen: soft, non-tender; bowel sounds normal; no masses,  no organomegaly Extremities: extremities normal, atraumatic, no cyanosis or edema Pulses: 2+ and symmetric Skin: Skin color, texture, turgor normal. No rashes or lesions Neurologic: Grossly normal Psych: Pleasant  EKG: Deferred  ASSESSMENT: 1. Mixed dyslipidemia 2. ASCVD 3. 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   [name] has a mixed dyslipidemia with coronary disease and a target LDL less than 70.  Unfortunately he cannot tolerate his current dose of atorvastatin let alone the increased dose which was proposed.  He has tried other statins in the past and therefore would be considered statin  intolerant.  Options include switching to a PCSK9 inhibitor, Nexletol, or perhaps combination of moderate potency statin as well as ezetimibe.  He is hesitant to do injections and concerned about the cost of a PCSK9 inhibitor at this time.  An option that has not been tried is Vytorin generic.  This could be 20 mg simvastatin 10 mg of ezetimibe.  I think the combination is likely to be helpful and drive his LDL less than 70 as it should give him about 50% reduction in cholesterol.  He is agreeable to trying that and we could consider other options if he does not reach target  or has myalgias with simvastatin.  Plan follow-up with me in 3 months with a lipid profile.  Thanks again for the kind referral.  Pixie Casino, MD, FACC, [place] Director of the [facility]  [facility] Diplomate of the American Board of Clinical Lipidology Attending Cardiologist  Direct Dial: [phone]  Fax: [phone]  Website:  [email or web address] 03/18/2019, 4:30 PM

## 2019-06-07 LAB — LIPID PANEL
Chol/HDL Ratio: 2.8 ratio (ref 0.0–5.0)
Cholesterol, Total: 136 mg/dL (ref 100–199)
HDL: 48 mg/dL (ref >39)
LDL Chol Calc (NIH): 69 mg/dL (ref 0–99)
Triglycerides: 101 mg/dL (ref 0–149)
VLDL Cholesterol Cal: 19 mg/dL (ref 5–40)

## 2019-06-14 ENCOUNTER — Ambulatory Visit: Payer: BC Managed Care – PPO | Admitting: Internal Medicine

## 2019-06-14 ENCOUNTER — Other Ambulatory Visit: Payer: Self-pay

## 2019-06-14 ENCOUNTER — Encounter: Payer: Self-pay | Admitting: Internal Medicine

## 2019-06-14 VITALS — BP 124/62 | HR 78 | Temp 98.4°F | Ht 70.0 in | Wt 174.4 lb

## 2019-06-14 DIAGNOSIS — E785 Hyperlipidemia, unspecified: Secondary | ICD-10-CM | POA: Diagnosis not present

## 2019-06-14 DIAGNOSIS — M791 Myalgia, unspecified site: Secondary | ICD-10-CM

## 2019-06-14 DIAGNOSIS — R748 Abnormal levels of other serum enzymes: Secondary | ICD-10-CM | POA: Diagnosis not present

## 2019-06-14 DIAGNOSIS — R1011 Right upper quadrant pain: Secondary | ICD-10-CM | POA: Diagnosis not present

## 2019-06-14 DIAGNOSIS — T466X5A Adverse effect of antihyperlipidemic and antiarteriosclerotic drugs, initial encounter: Secondary | ICD-10-CM

## 2019-06-14 NOTE — Progress Notes (Signed)
LIPID CLINIC CONSULT NOTE  Chief Complaint:  Manage dyslipidemia  Primary Care Physician: Hulan Fess, MD  Primary Cardiologist:  No primary care provider on file.  HPI:  [name] [name] is a 59 y.o. male who is being seen today for the evaluation of dyslipidemia at the request of Little, Lennette Bihari, MD.  This is a pleasant 59 year old male kindly referred by Dr. Radford Pax for evaluation and management of dyslipidemia.  He has a history of coronary artery disease with a 40 to 50% LAD stenosis by cath in 2006.  His target LDL is less than 70 however most recently his labs showed total cholesterol 150, triglycerides 102, HDL 48 and LDL of 83.  He was on atorvastatin 40 mg daily which he said actually caused him some myalgias.  In fact he had previously cut back to 20 mg of atorvastatin because he felt that this was better tolerated.  It was advised that he increase his atorvastatin further to 80 mg however he did not do this because of concerns of myopathy.  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  At times he has cramping in his hands and some difficulty being able to do his job.  He also says that he had previously taken Crestor in the past but it also was not tolerated   06/14/2019  [name] returns today for follow-up.  He continues to do well.  His lipids have improved further.  His total cholesterol is now 136, triglycerides 101, HDL 48 and LDL 69, improved from 83.  He was concerned about the combination ezetimibe/simvastatin that he is on.  Most notably he had an episode several months ago where he had some right upper quadrant abdominal pain.  He said it was fairly acute although he had been doing a lot of heavier work outside and thought it felt more like a cramp.  Subsequently his endocrinologist did some lab work and indicated that he had some elevated liver enzymes.  He was quite upset about that because of the significant increase in the numbers and then he discontinued  the medicine.  I reviewed those today and it indicated his AST and ALT were between 50 and 60 respectively, which is just barely over the upper limit of normal.  Previously he said they always had run in the 19's to low 20s.  He had repeat testing of his liver enzymes after stopping the medications and they normalized.  In the past has had an MRI of the abdomen which showed no significant biliary or hepatic pathology.  Recently he had a CT scan apparently that was also negative as part of continued work-up for renal pathology by his urologist.  I reassured him that the mild increase in his liver enzymes of possibly related to the Vytorin were not concerning or need to discontinue the medicine.  He has restarted it and obviously his lipid numbers do look better.  One possible cause could be hepatic steatosis however this was not previously mentioned.  There are also some biliary disorders which are not well visualized on CT.  PMHx:  Past Medical History:  Diagnosis Date  . Allergic reaction caused by a drug 03/07/2013  . Asthma    uses asmanex and albuterol  . Coronary artery disease 07/17/2004   nonobstructive ASCAD with 40-50% LAD  . Dyslipidemia   . Hyperlipidemia   . Hypertension   . Nephrolithiasis   . Pericarditis 2016   resolved on followup echo  .  Type I diabetes mellitus ([place])    ; followed by Dr. Sherrie George    Past Surgical History:  Procedure Laterality Date  . BACK SURGERY  2013   LOWER   . CARDIAC CATHETERIZATION    . COLONSCOPY AND ENDOSCOPY  03/2017  . LITHOTRIPSY  YRS AGO X 1  . ORCHIECTOMY Right 12/14/2013   Procedure: RIGHT RADICAL ORCHIECTOMY WITH PLACEMENT OF TESTICULAR PROTHESIS;  Surgeon: Ardis Hughs, MD;  Location: [facility];  Service: Urology;  Laterality: Right;  . ROBOTIC ASSITED PARTIAL NEPHRECTOMY Left 10/28/2017   Procedure: XI ROBOTIC ASSITED PARTIAL LEFT NEPHRECTOMY;  Surgeon: Ardis Hughs, MD;  Location: [facility];   Service: Urology;  Laterality: Left;    FAMHx:  Family History  Problem Relation Age of Onset  . Coronary artery disease Father   . Diabetes Father   . Hypertension Father   . Lung cancer Mother   . Diabetes Brother   . Hypothyroidism Sister   . Diabetes Paternal Grandmother   . Cancer Maternal Aunt   . Cancer Maternal Uncle     SOCHx:   reports that he has never smoked. He has never used smokeless tobacco. He reports current alcohol use of about 3.0 standard drinks of alcohol per week. He reports that he does not use drugs.  ALLERGIES:  Allergies  Allergen Reactions  . Cardizem [Diltiazem Hcl] Other (See Comments)    Flushing and chest pressure  . Carvedilol Hives and Itching  . Levofloxacin Other (See Comments)    stomach upset    ROS: Pertinent items noted in HPI and remainder of comprehensive ROS otherwise negative.  HOME MEDS: Current Outpatient Medications on File Prior to Visit  Medication Sig Dispense Refill  . albuterol (PROVENTIL HFA;VENTOLIN HFA) 108 (90 Base) MCG/ACT inhaler Inhale 2 puffs into the lungs every 4 (four) hours as needed. 1 Inhaler 6  . amLODipine (NORVASC) 10 MG tablet Take 1 tablet (10 mg total) by mouth daily. 90 tablet 3  . BAQSIMI ONE PACK 3 MG/DOSE POWD AS DIRECTED FOR LOW BLOOD SUGAR REACTION NASALLY 90 DAYS    . BYSTOLIC 2.5 MG tablet TAKE 1 TABLET BY MOUTH EVERY DAY 90 tablet 2  . Continuous Blood Gluc Sensor (FREESTYLE LIBRE 14 DAY SENSOR) MISC . CHANGE SENSOR EVERY 14 DAYS . 28 DAYS    . ezetimibe-simvastatin (VYTORIN) 10-20 MG tablet Take 1 tablet by mouth daily. 90 tablet 3  . glucagon (GLUCAGON EMERGENCY) 1 MG injection Inject 1 mg into the vein once as needed (for low BS).     Marland Kitchen insulin aspart (NOVOLOG) 100 UNIT/ML injection Inject 4-12 Units into the skin 3 (three) times daily with meals. Per sliding scale 1 unit per 6 carbs    . Insulin Glargine (BASAGLAR KWIKPEN) 100 UNIT/ML SOPN Inject 38 Units into the skin daily.    Marland Kitchen  losartan (COZAAR) 50 MG tablet Take 1 tablet (50 mg total) by mouth daily. 90 tablet 3  . meloxicam (MOBIC) 15 MG tablet Take 15 mg by mouth daily.    . nitroGLYCERIN (NITROSTAT) 0.4 MG SL tablet Place 1 tablet (0.4 mg total) under the tongue every 5 (five) minutes as needed for chest pain. 25 tablet 1  . tamsulosin (FLOMAX) 0.4 MG CAPS capsule Take 0.4 mg by mouth daily.     No current facility-administered medications on file prior to visit.    LABS/IMAGING: No results found for this or any previous visit (from the past 48 hour(s)). No  results found.  LIPID PANEL:    Component Value Date/Time   CHOL 136 06/07/2019 0829   CHOL 126 02/01/2014 0823   TRIG 101 06/07/2019 0829   TRIG 55 02/01/2014 0823   HDL 48 06/07/2019 0829   HDL 68 02/01/2014 0823   CHOLHDL 2.8 06/07/2019 0829   CHOLHDL 2.0 09/18/2015 0814   VLDL 8 09/18/2015 0814   LDLCALC 69 06/07/2019 0829   LDLCALC 47 02/01/2014 0823    WEIGHTS: Wt Readings from Last 3 Encounters:  06/14/19 174 lb 6.4 oz (79.1 kg)  03/16/19 172 lb (78 kg)  11/06/18 167 lb 12.8 oz (76.1 kg)    VITALS: BP 124/62   Pulse 78   Temp 98.4 F (36.9 C)   Ht 5\' 10"  (1.778 m)   Wt 174 lb 6.4 oz (79.1 kg)   SpO2 97%   BMI 25.02 kg/m   EXAM: General appearance: alert and no distress Neck: no carotid bruit, no JVD and thyroid not enlarged, symmetric, no tenderness/mass/nodules Lungs: clear to auscultation bilaterally Heart: regular rate and rhythm, S1, S2 normal, no murmur, click, rub or gallop Abdomen: soft, non-tender; bowel sounds normal; no masses,  no organomegaly Extremities: extremities normal, atraumatic, no cyanosis or edema Pulses: 2+ and symmetric Skin: Skin color, texture, turgor normal. No rashes or lesions Neurologic: Grossly normal Psych: Pleasant  EKG: Deferred  ASSESSMENT: 1. Mixed dyslipidemia 2. ASCVD 3. 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   Mr. Brookbank has reached his lipid targets on the  generic Vytorin.  He recently had elevated liver enzymes which improved after stopping the medicine and then has restarted it.  The elevations are minimal and much less than 3 times the upper limit of normal and I think are acceptable.  I would like to go ahead and get a right upper quadrant ultrasound however the evaluate for any possible biliary pathology or perhaps cholesterol stones or sludge which may not be radiopaque and well visualized on the CT scan.  10 you Vytorin for now.  Follow-up in 3 months with repeat liver enzymes.  Pixie Casino, MD, [facility], [place] Director of the Advanced Lipid Disorders &  Cardiovascular Risk Reduction Clinic Diplomate of the American Board of Clinical Lipidology Attending Cardiologist  Direct Dial: [phone]  Fax: [phone]  Website:  [email or web address] 06/14/2019, 9:18 AM

## 2019-06-14 NOTE — Patient Instructions (Signed)
Medication Instructions:  Your physician recommends that you continue on your current medications as directed. Please refer to the Current Medication list given to you today.  *If you need a refill on your cardiac medications before your next appointment, please call your pharmacy*   Lab Work: FASTING lab work in 3 months to check cholesterol & liver function  If you have labs (blood work) drawn today and your tests are completely normal, you will receive your results only by: Marland Kitchen MyChart Message (if you have MyChart) OR . A paper copy in the mail If you have any lab test that is abnormal or we need to change your treatment, we will call you to review the results.   Testing/Procedures: Abdominal U/S @ [address] [facility]   Follow-Up: At [facility], you and your health needs are our priority.  As part of our continuing mission to provide you with exceptional heart care, we have created designated Provider Care Teams.  These Care Teams include your primary Cardiologist (physician) and Advanced Practice Providers (APPs -  Physician Assistants and Nurse Practitioners) who all work together to provide you with the care you need, when you need it.  We recommend signing up for the patient portal called "MyChart".  Sign up information is provided on this After Visit Summary.  MyChart is used to connect with patients for Virtual Visits (Telemedicine).  Patients are able to view lab/test results, encounter notes, upcoming appointments, etc.  Non-urgent messages can be sent to your provider as well.   To learn more about what you can do with MyChart, go to [email or web address].    Your next appointment:   3 month(s) - lipid clinic  The format for your next appointment:   Either In Person or Virtual  Provider:   K. Mali Hilty, MD   Other Instructions

## 2019-06-21 ENCOUNTER — Ambulatory Visit
Admission: RE | Admit: 2019-06-21 | Discharge: 2019-06-21 | Disposition: A | Discharge status: Home / Self Care | Payer: BC Managed Care – PPO | Source: Ambulatory Visit | Attending: Internal Medicine | Admitting: Internal Medicine

## 2019-06-21 ENCOUNTER — Other Ambulatory Visit: Payer: Self-pay

## 2019-06-21 DIAGNOSIS — R1011 Right upper quadrant pain: Secondary | ICD-10-CM

## 2019-06-21 DIAGNOSIS — R748 Abnormal levels of other serum enzymes: Secondary | ICD-10-CM

## 2019-06-22 ENCOUNTER — Other Ambulatory Visit: Payer: Self-pay

## 2019-06-22 MED ORDER — LOSARTAN POTASSIUM 50 MG PO TABS: 50.0000 mg | ORAL_TABLET | Freq: Every day | ORAL | 0 refills | Status: DC

## 2019-09-03 ENCOUNTER — Other Ambulatory Visit: Payer: Self-pay | Admitting: Cardiology

## 2019-09-14 LAB — HEPATIC FUNCTION PANEL
ALT: 30 IU/L (ref 0–44)
AST: 25 IU/L (ref 0–40)
Albumin: 4.6 g/dL (ref 3.8–4.9)
Alkaline Phosphatase: 82 IU/L (ref 48–121)
Bilirubin Total: 0.4 mg/dL (ref 0.0–1.2)
Bilirubin, Direct: 0.12 mg/dL (ref 0.00–0.40)
Total Protein: 6.6 g/dL (ref 6.0–8.5)

## 2019-09-14 LAB — LIPID PANEL
Chol/HDL Ratio: 2.9 ratio (ref 0.0–5.0)
Cholesterol, Total: 140 mg/dL (ref 100–199)
HDL: 48 mg/dL (ref >39)
LDL Chol Calc (NIH): 73 mg/dL (ref 0–99)
Triglycerides: 102 mg/dL (ref 0–149)
VLDL Cholesterol Cal: 19 mg/dL (ref 5–40)

## 2019-09-19 ENCOUNTER — Encounter: Payer: Self-pay | Admitting: Internal Medicine

## 2019-09-19 ENCOUNTER — Ambulatory Visit: Payer: BC Managed Care – PPO | Admitting: Internal Medicine

## 2019-09-19 ENCOUNTER — Other Ambulatory Visit: Payer: Self-pay

## 2019-09-19 VITALS — BP 128/62 | HR 72 | Ht 70.0 in | Wt 175.2 lb

## 2019-09-19 DIAGNOSIS — M791 Myalgia, unspecified site: Secondary | ICD-10-CM | POA: Diagnosis not present

## 2019-09-19 DIAGNOSIS — T466X5A Adverse effect of antihyperlipidemic and antiarteriosclerotic drugs, initial encounter: Secondary | ICD-10-CM

## 2019-09-19 DIAGNOSIS — I2510 Atherosclerotic heart disease of native coronary artery without angina pectoris: Secondary | ICD-10-CM

## 2019-09-19 DIAGNOSIS — E785 Hyperlipidemia, unspecified: Secondary | ICD-10-CM

## 2019-09-19 NOTE — Progress Notes (Signed)
LIPID CLINIC CONSULT NOTE  Chief Complaint:  Manage dyslipidemia  Primary Care Physician: Hulan Fess, MD  Primary Cardiologist:  No primary care provider on file.  HPI:  [name] [name] is a 59 y.o. male who is being seen today for the evaluation of dyslipidemia at the request of Little, Lennette Bihari, MD.  This is a pleasant 59 year old male kindly referred by Dr. Radford Pax for evaluation and management of dyslipidemia.  He has a history of coronary artery disease with a 40 to 50% LAD stenosis by cath in 2006.  His target LDL is less than 70 however most recently his labs showed total cholesterol 150, triglycerides 102, HDL 48 and LDL of 83.  He was on atorvastatin 40 mg daily which he said actually caused him some myalgias.  In fact he had previously cut back to 20 mg of atorvastatin because he felt that this was better tolerated.  It was advised that he increase his atorvastatin further to 80 mg however he did not do this because of concerns of myopathy.  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  At times he has cramping in his hands and some difficulty being able to do his job.  He also says that he had previously taken Crestor in the past but it also was not tolerated   06/14/2019  [name] [name] returns today for follow-up.  He continues to do well.  His lipids have improved further.  His total cholesterol is now 136, triglycerides 101, HDL 48 and LDL 69, improved from 83.  He was concerned about the combination ezetimibe/simvastatin that he is on.  Most notably he had an episode several months ago where he had some right upper quadrant abdominal pain.  He said it was fairly acute although he had been doing a lot of heavier work outside and thought it felt more like a cramp.  Subsequently his endocrinologist did some lab work and indicated that he had some elevated liver enzymes.  He was quite upset about that because of the significant increase in the numbers and then he discontinued  the medicine.  I reviewed those today and it indicated his AST and ALT were between 50 and 60 respectively, which is just barely over the upper limit of normal.  Previously he said they always had run in the 19's to low 20s.  He had repeat testing of his liver enzymes after stopping the medications and they normalized.  In the past has had an MRI of the abdomen which showed no significant biliary or hepatic pathology.  Recently he had a CT scan apparently that was also negative as part of continued work-up for renal pathology by his urologist.  I reassured him that the mild increase in his liver enzymes of possibly related to the Vytorin were not concerning or need to discontinue the medicine.  He has restarted it and obviously his lipid numbers do look better.  One possible cause could be hepatic steatosis however this was not previously mentioned.  There are also some biliary disorders which are not well visualized on CT.  09/19/2019  [name] [name] returns today for follow-up. He is overall doing well. His lipids have been fairly stable on combination simvastatin/ezetimibe. Total cholesterol 140, triglycerides 102, HDL 48, LDL 73. He is AST and ALT are 25 and 30 which have been stable. We did do a right upper quadrant ultrasound which was negative for any hepatic steatosis or abnormal pathology to explain elevated liver enzymes  in the past. He otherwise remains asymptomatic. He is due for repeat check of his A1c.  PMHx:  Past Medical History:  Diagnosis Date  . Allergic reaction caused by a drug 03/07/2013  . Asthma    uses asmanex and albuterol  . Coronary artery disease 07/17/2004   nonobstructive ASCAD with 40-50% LAD  . Dyslipidemia   . Hyperlipidemia   . Hypertension   . Nephrolithiasis   . Pericarditis 2016   resolved on followup echo  . Type I diabetes mellitus ([place])    ; followed by Dr. Sherrie George    Past Surgical History:  Procedure Laterality Date  . BACK SURGERY   2013   LOWER   . CARDIAC CATHETERIZATION    . COLONSCOPY AND ENDOSCOPY  03/2017  . LITHOTRIPSY  YRS AGO X 1  . ORCHIECTOMY Right 12/14/2013   Procedure: RIGHT RADICAL ORCHIECTOMY WITH PLACEMENT OF TESTICULAR PROTHESIS;  Surgeon: Ardis Hughs, MD;  Location: [facility];  Service: Urology;  Laterality: Right;  . ROBOTIC ASSITED PARTIAL NEPHRECTOMY Left 10/28/2017   Procedure: XI ROBOTIC ASSITED PARTIAL LEFT NEPHRECTOMY;  Surgeon: Ardis Hughs, MD;  Location: [facility];  Service: Urology;  Laterality: Left;    FAMHx:  Family History  Problem Relation Age of Onset  . Coronary artery disease Father   . Diabetes Father   . Hypertension Father   . Lung cancer Mother   . Diabetes Brother   . Hypothyroidism Sister   . Diabetes Paternal Grandmother   . Cancer Maternal Aunt   . Cancer Maternal Uncle     SOCHx:   reports that he has never smoked. He has never used smokeless tobacco. He reports current alcohol use of about 3.0 standard drinks of alcohol per week. He reports that he does not use drugs.  ALLERGIES:  Allergies  Allergen Reactions  . Cardizem [Diltiazem Hcl] Other (See Comments)    Flushing and chest pressure  . Carvedilol Hives and Itching  . Levofloxacin Other (See Comments)    stomach upset    ROS: Pertinent items noted in HPI and remainder of comprehensive ROS otherwise negative.  HOME MEDS: Current Outpatient Medications on File Prior to Visit  Medication Sig Dispense Refill  . albuterol (PROVENTIL HFA;VENTOLIN HFA) 108 (90 Base) MCG/ACT inhaler Inhale 2 puffs into the lungs every 4 (four) hours as needed. 1 Inhaler 6  . amLODipine (NORVASC) 10 MG tablet Take 1 tablet (10 mg total) by mouth daily. 90 tablet 3  . BAQSIMI ONE PACK 3 MG/DOSE POWD AS DIRECTED FOR LOW BLOOD SUGAR REACTION NASALLY 90 DAYS    . BYSTOLIC 2.5 MG tablet TAKE 1 TABLET BY MOUTH EVERY DAY 90 tablet 2  . Continuous Blood Gluc Sensor (FREESTYLE LIBRE 14 DAY SENSOR) MISC .  CHANGE SENSOR EVERY 14 DAYS . 28 DAYS    . ezetimibe-simvastatin (VYTORIN) 10-20 MG tablet Take 1 tablet by mouth daily. 90 tablet 3  . glucagon (GLUCAGON EMERGENCY) 1 MG injection Inject 1 mg into the vein once as needed (for low BS).     Marland Kitchen insulin aspart (NOVOLOG) 100 UNIT/ML injection Inject 4-12 Units into the skin 3 (three) times daily with meals. Per sliding scale 1 unit per 6 carbs    . Insulin Glargine (BASAGLAR KWIKPEN) 100 UNIT/ML SOPN Inject 38 Units into the skin daily.    Marland Kitchen losartan (COZAAR) 50 MG tablet TAKE 1 TABLET BY MOUTH EVERY DAY 90 tablet 3  . meloxicam (MOBIC) 15 MG tablet  Take 15 mg by mouth daily.    . nitroGLYCERIN (NITROSTAT) 0.4 MG SL tablet Place 1 tablet (0.4 mg total) under the tongue every 5 (five) minutes as needed for chest pain. 25 tablet 1  . tamsulosin (FLOMAX) 0.4 MG CAPS capsule Take 0.4 mg by mouth daily.     No current facility-administered medications on file prior to visit.    LABS/IMAGING: No results found for this or any previous visit (from the past 48 hour(s)). No results found.  LIPID PANEL:    Component Value Date/Time   CHOL 140 09/14/2019 0816   CHOL 126 02/01/2014 0823   TRIG 102 09/14/2019 0816   TRIG 55 02/01/2014 0823   HDL 48 09/14/2019 0816   HDL 68 02/01/2014 0823   CHOLHDL 2.9 09/14/2019 0816   CHOLHDL 2.0 09/18/2015 0814   VLDL 8 09/18/2015 0814   LDLCALC 73 09/14/2019 0816   LDLCALC 47 02/01/2014 0823    WEIGHTS: Wt Readings from Last 3 Encounters:  09/19/19 175 lb 3.2 oz (79.5 kg)  06/14/19 174 lb 6.4 oz (79.1 kg)  03/16/19 172 lb (78 kg)    VITALS: BP 128/62   Pulse 72   Ht 5\' 10"  (1.778 m)   Wt 175 lb 3.2 oz (79.5 kg)   SpO2 96%   BMI 25.14 kg/m   EXAM: General appearance: alert and no distress Neck: no carotid bruit, no JVD and thyroid not enlarged, symmetric, no tenderness/mass/nodules Lungs: clear to auscultation bilaterally Heart: regular rate and rhythm, S1, S2 normal, no murmur, click, rub or  gallop Abdomen: soft, non-tender; bowel sounds normal; no masses,  no organomegaly Extremities: extremities normal, atraumatic, no cyanosis or edema Pulses: 2+ and symmetric Skin: Skin color, texture, turgor normal. No rashes or lesions Neurologic: Grossly normal Psych: Pleasant  EKG: Deferred  ASSESSMENT: 1. Mixed dyslipidemia 2. ASCVD 3. 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   [name] [name] continues to do well with stable lipid profile and normal liver enzymes. His ultrasound was negative for any obvious pathology. We will plan to continue his current medications and repeat lipids in a year with follow-up with me at that time.  Pixie Casino, MD, [facility], [place] Director of the Advanced Lipid Disorders &  Cardiovascular Risk Reduction Clinic Diplomate of the American Board of Clinical Lipidology Attending Cardiologist  Direct Dial: [phone]  Fax: [phone]  Website:  [email or web address] 09/19/2019, 10:42 AM

## 2019-09-19 NOTE — Patient Instructions (Signed)
Medication Instructions:  Your physician recommends that you continue on your current medications as directed. Please refer to the Current Medication list given to you today.  *If you need a refill on your cardiac medications before your next appointment, please call your pharmacy*   Lab Work: FASTING lab work before next visit with Dr. Debara Pickett   If you have labs (blood work) drawn today and your tests are completely normal, you will receive your results only by: Marland Kitchen MyChart Message (if you have MyChart) OR . A paper copy in the mail If you have any lab test that is abnormal or we need to change your treatment, we will call you to review the results.   Testing/Procedures: NONE   Follow-Up: At [facility], you and your health needs are our priority.  As part of our continuing mission to provide you with exceptional heart care, we have created designated Provider Care Teams.  These Care Teams include your primary Cardiologist (physician) and Advanced Practice Providers (APPs -  Physician Assistants and Nurse Practitioners) who all work together to provide you with the care you need, when you need it.  We recommend signing up for the patient portal called "MyChart".  Sign up information is provided on this After Visit Summary.  MyChart is used to connect with patients for Virtual Visits (Telemedicine).  Patients are able to view lab/test results, encounter notes, upcoming appointments, etc.  Non-urgent messages can be sent to your provider as well.   To learn more about what you can do with MyChart, go to [email or web address].    Your next appointment:   12 month(s) - lipid clinic  The format for your next appointment:   Either In Person or Virtual  Provider:   K. Mali Hilty, MD   Other Instructions

## 2019-11-18 ENCOUNTER — Other Ambulatory Visit: Payer: Self-pay | Admitting: Cardiology

## 2019-12-07 ENCOUNTER — Other Ambulatory Visit: Payer: Self-pay | Admitting: Cardiology

## 2019-12-14 ENCOUNTER — Other Ambulatory Visit: Payer: Self-pay | Admitting: Cardiology

## 2019-12-23 ENCOUNTER — Other Ambulatory Visit: Payer: Self-pay | Admitting: Cardiology

## 2020-01-02 ENCOUNTER — Other Ambulatory Visit: Payer: Self-pay | Admitting: Cardiology

## 2020-02-01 ENCOUNTER — Other Ambulatory Visit: Payer: Self-pay | Admitting: Cardiology

## 2020-02-12 ENCOUNTER — Encounter: Payer: Self-pay | Admitting: Cardiology

## 2020-02-12 ENCOUNTER — Other Ambulatory Visit: Payer: Self-pay

## 2020-02-12 ENCOUNTER — Ambulatory Visit: Payer: BC Managed Care – PPO | Admitting: Cardiology

## 2020-02-12 VITALS — BP 144/68 | HR 65 | Ht 70.0 in | Wt 171.2 lb

## 2020-02-12 DIAGNOSIS — Z8679 Personal history of other diseases of the circulatory system: Secondary | ICD-10-CM | POA: Diagnosis not present

## 2020-02-12 DIAGNOSIS — I10 Essential (primary) hypertension: Secondary | ICD-10-CM

## 2020-02-12 DIAGNOSIS — E785 Hyperlipidemia, unspecified: Secondary | ICD-10-CM | POA: Diagnosis not present

## 2020-02-12 DIAGNOSIS — I2510 Atherosclerotic heart disease of native coronary artery without angina pectoris: Secondary | ICD-10-CM | POA: Diagnosis not present

## 2020-02-12 NOTE — Progress Notes (Signed)
Cardiology Office Note:    Date:  02/12/2020   ID:  [name], DOB 01/15/1961, MRN [id number]  PCP:  [name] Fess, MD  Cardiologist:  [name] Him, MD    Referring MD: [name] Fess, MD   Chief Complaint  Patient presents with  . Coronary Artery Disease  . Hypertension  . Hyperlipidemia    History of Present Illness:    [name] [name] is a 59 y.o. male with a hx of nonobstructive ASCADwith 40-50% LAD, HTN, h/o pericarditis with pericardial effusion (resolved with NSAIDS) anddyslipidemia.  he is here today for followup and is doing well.  he denies any chest pain or pressure, SOB, DOE, PND, orthopnea, LE edema,  palpitations or syncope. He occasionally has some dizziness if he uses the Flomax.  He is compliant with his meds and is tolerating meds with no SE.    Past Medical History:  Diagnosis Date  . Allergic reaction caused by a drug 03/07/2013  . Asthma    uses asmanex and albuterol  . Coronary artery disease 07/17/2004   nonobstructive ASCAD with 40-50% LAD  . Dyslipidemia   . Hyperlipidemia   . Hypertension   . Nephrolithiasis   . Pericarditis 2016   resolved on followup echo  . Type I diabetes mellitus ([place])    ; followed by Dr. Sherrie [name]    Past Surgical History:  Procedure Laterality Date  . BACK SURGERY  2013   LOWER   . CARDIAC CATHETERIZATION    . COLONSCOPY AND ENDOSCOPY  03/2017  . LITHOTRIPSY  YRS AGO X 1  . ORCHIECTOMY Right 12/14/2013   Procedure: RIGHT RADICAL ORCHIECTOMY WITH PLACEMENT OF TESTICULAR PROTHESIS;  Surgeon: Ardis Hughs, MD;  Location: [facility];  Service: Urology;  Laterality: Right;  . ROBOTIC ASSITED PARTIAL NEPHRECTOMY Left 10/28/2017   Procedure: XI ROBOTIC ASSITED PARTIAL LEFT NEPHRECTOMY;  Surgeon: Ardis Hughs, MD;  Location: [facility];  Service: Urology;  Laterality: Left;    Current Medications: Current Meds  Medication Sig  . albuterol (PROVENTIL HFA;VENTOLIN HFA) 108 (90  Base) MCG/ACT inhaler Inhale 2 puffs into the lungs every 4 (four) hours as needed.  Marland Kitchen amLODipine (NORVASC) 10 MG tablet Take 1 tablet (10 mg total) by mouth daily.  Marland Kitchen BAQSIMI ONE PACK 3 MG/DOSE POWD AS DIRECTED FOR LOW BLOOD SUGAR REACTION NASALLY 90 DAYS  . Continuous Blood Gluc Sensor (FREESTYLE LIBRE 14 DAY SENSOR) MISC . CHANGE SENSOR EVERY 14 DAYS . 28 DAYS  . ezetimibe-simvastatin (VYTORIN) 10-20 MG tablet Take 1 tablet by mouth daily.  Marland Kitchen glucagon (GLUCAGON EMERGENCY) 1 MG injection Inject 1 mg into the vein once as needed (for low BS).   Marland Kitchen insulin aspart (NOVOLOG) 100 UNIT/ML injection Inject 4-12 Units into the skin 3 (three) times daily with meals. Per sliding scale 1 unit per 6 carbs  . Insulin Glargine (BASAGLAR KWIKPEN) 100 UNIT/ML SOPN Inject 38 Units into the skin daily.  Marland Kitchen losartan (COZAAR) 50 MG tablet TAKE 1 TABLET BY MOUTH EVERY DAY  . meloxicam (MOBIC) 15 MG tablet Take 15 mg by mouth daily as needed (muscle spasms).   . nebivolol (BYSTOLIC) 2.5 MG tablet Take 1 tablet (2.5 mg total) by mouth daily. Please keep 02/12/2020 appointment for for further refills  . nitroGLYCERIN (NITROSTAT) 0.4 MG SL tablet Place 1 tablet (0.4 mg total) under the tongue every 5 (five) minutes as needed for chest pain.  . tamsulosin (FLOMAX) 0.4 MG CAPS capsule Take 0.4 mg  by mouth daily as needed (prostate muscles).      Allergies:   Cardizem [diltiazem hcl], Carvedilol, and Levofloxacin   Social History   Socioeconomic History  . Marital status: Married    Spouse name: Not on file  . Number of children: Not on file  . Years of education: Not on file  . Highest education level: Not on file  Occupational History  . Occupation: Pharmacist, hospital  Tobacco Use  . Smoking status: Never Smoker  . Smokeless tobacco: Never Used  Vaping Use  . Vaping Use: Never used  Substance and Sexual Activity  . Alcohol use: Yes    Alcohol/week: 3.0 standard drinks    Types: 3 Cans of beer per week    Comment: 3 x  weekly  . Drug use: No  . Sexual activity: Not on file  Other Topics Concern  . Not on file  Social History Narrative  . Not on file   Social Determinants of Health   Financial Resource Strain:   . Difficulty of Paying Living Expenses: Not on file  Food Insecurity:   . Worried About Charity fundraiser in the Last Year: Not on file  . Ran Out of Food in the Last Year: Not on file  Transportation Needs:   . Lack of Transportation (Medical): Not on file  . Lack of Transportation (Non-Medical): Not on file  Physical Activity:   . Days of Exercise per Week: Not on file  . Minutes of Exercise per Session: Not on file  Stress:   . Feeling of Stress : Not on file  Social Connections:   . Frequency of Communication with Friends and Family: Not on file  . Frequency of Social Gatherings with Friends and Family: Not on file  . Attends Religious Services: Not on file  . Active Member of Clubs or Organizations: Not on file  . Attends Archivist Meetings: Not on file  . Marital Status: Not on file     Family History: The patient's family history includes Cancer in his maternal aunt and maternal uncle; Coronary artery disease in his father; Diabetes in his brother, father, and paternal grandmother; Hypertension in his father; Hypothyroidism in his sister; Lung cancer in his mother.  ROS:   Please see the history of present illness.    ROS  All other systems reviewed and negative.   EKGs/Labs/Other Studies Reviewed:    The following studies were reviewed today: none  EKG:  EKG is  ordered today.  The ekg ordered today demonstrates   Recent Labs: 09/14/2019: ALT 30   Recent Lipid Panel    Component Value Date/Time   CHOL 140 09/14/2019 0816   CHOL 126 02/01/2014 0823   TRIG 102 09/14/2019 0816   TRIG 55 02/01/2014 0823   HDL 48 09/14/2019 0816   HDL 68 02/01/2014 0823   CHOLHDL 2.9 09/14/2019 0816   CHOLHDL 2.0 09/18/2015 0814   VLDL 8 09/18/2015 0814   LDLCALC 73  09/14/2019 0816   LDLCALC 47 02/01/2014 0823    Physical Exam:    VS:  BP (!) 144/68   Pulse 65   Ht 5\' 10"  (1.778 m)   Wt 171 lb 3.2 oz (77.7 kg)   BMI 24.56 kg/m     Wt Readings from Last 3 Encounters:  02/12/20 171 lb 3.2 oz (77.7 kg)  09/19/19 175 lb 3.2 oz (79.5 kg)  06/14/19 174 lb 6.4 oz (79.1 kg)     GEN: Well nourished,  well developed in no acute distress HEENT: Normal NECK: No JVD; No carotid bruits LYMPHATICS: No lymphadenopathy CARDIAC:RRR, no murmurs, rubs, gallops RESPIRATORY:  Clear to auscultation without rales, wheezing or rhonchi  ABDOMEN: Soft, non-tender, non-distended MUSCULOSKELETAL:  No edema; No deformity  SKIN: Warm and dry NEUROLOGIC:  Alert and oriented x 3 PSYCHIATRIC:  Normal affect    ASSESSMENT:    1. Coronary artery disease involving native coronary artery of native heart without angina pectoris   2. History of pericarditis   3. Primary hypertension   4. Dyslipidemia    PLAN:    In order of problems listed above:  1.  ASCAD -nonobstructive by cath -he denies any anginal symptoms -continue ASA 81mg  daily, BB and statin  2.  Pericardial effusion/pericarditis -this has resolved with no reoccurrence of CP -repeat echo showed no pericardial effusion  3.  Hypertension -Bp well controlled on exam  -continue Bystolic 2.5mg  daily, amlodipine 10mg  daily and Losartan 50mg  daily.  -his creatinine was 1 on 05/03/19  4.  Hyperlipidemia  his LDL goal is < 70.  -his last LDL was 73 in  June 20201 -he will continue on atorvastatin 20mg  daily   Medication Adjustments/Labs and Tests Ordered: Current medicines are reviewed at length with the patient today.  Concerns regarding medicines are outlined above.  Orders Placed This Encounter  Procedures  . EKG 12-Lead   No orders of the defined types were placed in this encounter.   Signed, [name] Him, MD  02/12/2020 1:08 PM    [place]

## 2020-02-12 NOTE — Patient Instructions (Signed)

## 2020-02-21 ENCOUNTER — Other Ambulatory Visit: Payer: Self-pay

## 2020-02-21 ENCOUNTER — Encounter: Payer: Self-pay | Admitting: Podiatry

## 2020-02-21 ENCOUNTER — Other Ambulatory Visit: Payer: Self-pay | Admitting: Podiatry

## 2020-02-21 ENCOUNTER — Ambulatory Visit (INDEPENDENT_AMBULATORY_CARE_PROVIDER_SITE_OTHER): Payer: BC Managed Care – PPO

## 2020-02-21 ENCOUNTER — Ambulatory Visit: Payer: BC Managed Care – PPO | Admitting: Podiatry

## 2020-02-21 DIAGNOSIS — M2012 Hallux valgus (acquired), left foot: Secondary | ICD-10-CM

## 2020-02-21 DIAGNOSIS — T148XXA Other injury of unspecified body region, initial encounter: Secondary | ICD-10-CM | POA: Diagnosis not present

## 2020-02-21 DIAGNOSIS — M79671 Pain in right foot: Secondary | ICD-10-CM

## 2020-02-21 DIAGNOSIS — M21612 Bunion of left foot: Secondary | ICD-10-CM

## 2020-02-21 DIAGNOSIS — S9032XA Contusion of left foot, initial encounter: Secondary | ICD-10-CM

## 2020-02-21 DIAGNOSIS — M79672 Pain in left foot: Secondary | ICD-10-CM

## 2020-02-21 DIAGNOSIS — E1065 Type 1 diabetes mellitus with hyperglycemia: Secondary | ICD-10-CM

## 2020-02-21 NOTE — Patient Instructions (Signed)
Look for Voltaren gel at the pharmacy over the counter or online (also known as diclofenac 1% gel). Apply to the painful areas 3-4x daily with the supplied dosing card. Allow to dry for 10 minutes before going into socks/shoes  

## 2020-02-21 NOTE — Progress Notes (Signed)
°  Subjective:  Patient ID: [name] [name], male    DOB: [birth year],  MRN: [id number]  Chief Complaint  Patient presents with   Foot Pain    Left foot pain at the hallux. 3 weeks ago Pt tripped going up the stairs and his big toe has been bothering him ever since     59 y.o. male presents with the above complaint. History confirmed with patient.  He hit the inside of the big toe on the step when he tripped up.  Has been painful and aching with sharp tingling shooting pains into the toes since.  Objective:  Physical Exam: warm, good capillary refill, no trophic changes or ulcerative lesions, normal DP and PT pulses and normal sensory exam. Left Foot: Moderate hallux valgus with a large dorsal medial eminence.  Tender to palpation.  Erythematous. Right Foot: No bony was noted   Radiographs: X-ray of both feet: no fracture, dislocation, swelling or degenerative changes noted, left foot he does have moderate hallux valgus with increased intermetatarsal angle and hallux abduction angle. Assessment:   1. Foot pain, bilateral   2. Hallux valgus with bunions, left   3. Peripheral nerve contusion   4. Contusion of left foot, initial encounter   5. Type 1 diabetes mellitus with hyperglycemia ([place])      Plan:  Patient was evaluated and treated and all questions answered.  Discussed with him that he likely has a contusion of the left foot that he had along the stair and has likely irritated the proper digital nerve to the hallux.  Hopefully this will resolve on its own, it could be worsened by the fact that he does have a bunion this may becoming symptomatic for him.  Discussed etiology and treatment options in detail of bunions.  He is a type I diabetic and his A1c is around 8% and he is due for recheck soon.  We discussed with him that we could consider correction of the bunion if it is well below 8% through a small incision approach.  In the interim I recommend topical treatment with  diclofenac gel and a silicone bunion shield for pain relief which I dispensed to him today.  We discussed shoe gear changes and avoiding tight fitting shoes that cause compression on this.  Return in about 6 weeks (around 04/03/2020).

## 2020-03-06 ENCOUNTER — Other Ambulatory Visit: Payer: Self-pay | Admitting: Internal Medicine

## 2020-03-10 ENCOUNTER — Other Ambulatory Visit: Payer: Self-pay | Admitting: Cardiology

## 2020-03-18 ENCOUNTER — Other Ambulatory Visit: Payer: Self-pay | Admitting: Cardiology

## 2020-04-03 ENCOUNTER — Ambulatory Visit: Payer: BC Managed Care – PPO | Admitting: Podiatry

## 2020-04-03 ENCOUNTER — Other Ambulatory Visit: Payer: Self-pay

## 2020-04-03 DIAGNOSIS — M79671 Pain in right foot: Secondary | ICD-10-CM

## 2020-04-03 DIAGNOSIS — M2012 Hallux valgus (acquired), left foot: Secondary | ICD-10-CM | POA: Diagnosis not present

## 2020-04-03 DIAGNOSIS — M21612 Bunion of left foot: Secondary | ICD-10-CM

## 2020-04-03 DIAGNOSIS — T148XXA Other injury of unspecified body region, initial encounter: Secondary | ICD-10-CM

## 2020-04-03 DIAGNOSIS — M79672 Pain in left foot: Secondary | ICD-10-CM

## 2020-04-03 DIAGNOSIS — E1065 Type 1 diabetes mellitus with hyperglycemia: Secondary | ICD-10-CM

## 2020-04-06 NOTE — Progress Notes (Signed)
  Subjective:  Patient ID: [name] [name], male    DOB: June 13, 1960,  MRN: [id number]  Chief Complaint  Patient presents with  . Follow-up    Pt states left foot hammertoe pain is about the same since his last visit.     59 y.o. male returns with the above complaint. History confirmed with patient.  Unfortunately his A1c has gone up and is almost 9%.  He is due to see his endocrinologist in February and will have 1 checked again there.  Bunion shield was not helpful.  Voltaren gel did not help much either.  Objective:  Physical Exam: warm, good capillary refill, no trophic changes or ulcerative lesions, normal DP and PT pulses and normal sensory exam. Left Foot: Moderate hallux valgus with a large dorsal medial eminence.  Tender to palpation.  Erythematous. Right Foot: No bony was noted   Radiographs: X-ray of both feet: no fracture, dislocation, swelling or degenerative changes noted, left foot he does have moderate hallux valgus with increased intermetatarsal angle and hallux abduction angle. Assessment:   1. Foot pain, bilateral   2. Hallux valgus with bunions, left   3. Peripheral nerve contusion   4. Type 1 diabetes mellitus with hyperglycemia ([place])      Plan:  Patient was evaluated and treated and all questions answered.  Fortunately his A1c has increased since last visit.  He is due for checking February with his endocrinologist.  Hopefully we get it back down into a reasonable range that surgical intervention with a minimally invasive bunionectomy would be a possibility.  In the interim we should try to offload with a wider and more comfortable shoe gear.  I recommend diabetic extra-depth insoles with accommodative inserts.  He will be scheduled with our pedorthist to have these fabricated.  Return in about 3 months (around 07/02/2020).

## 2020-04-24 ENCOUNTER — Ambulatory Visit (INDEPENDENT_AMBULATORY_CARE_PROVIDER_SITE_OTHER): Payer: BC Managed Care – PPO | Admitting: Orthotics

## 2020-04-24 ENCOUNTER — Other Ambulatory Visit: Payer: Self-pay

## 2020-04-24 DIAGNOSIS — M2012 Hallux valgus (acquired), left foot: Secondary | ICD-10-CM

## 2020-04-24 DIAGNOSIS — M79672 Pain in left foot: Secondary | ICD-10-CM

## 2020-04-24 DIAGNOSIS — M79671 Pain in right foot: Secondary | ICD-10-CM | POA: Diagnosis not present

## 2020-04-24 DIAGNOSIS — M21612 Bunion of left foot: Secondary | ICD-10-CM

## 2020-04-24 DIAGNOSIS — T148XXA Other injury of unspecified body region, initial encounter: Secondary | ICD-10-CM | POA: Diagnosis not present

## 2020-04-24 NOTE — Progress Notes (Signed)
Cast today for f/o and not diabetic inserts/shoes:  He has [employer] plan [place] and they will not cover DBS/inserts, but will cover well inserts.   He also expressed discomfort upon dorsiflexion of hallux left so I am incorporating a motron extension as well

## 2020-05-22 ENCOUNTER — Other Ambulatory Visit: Payer: Self-pay

## 2020-05-22 ENCOUNTER — Ambulatory Visit: Payer: BC Managed Care – PPO | Admitting: Orthotics

## 2020-05-22 DIAGNOSIS — M2012 Hallux valgus (acquired), left foot: Secondary | ICD-10-CM

## 2020-05-22 DIAGNOSIS — M79672 Pain in left foot: Secondary | ICD-10-CM

## 2020-05-22 DIAGNOSIS — M79671 Pain in right foot: Secondary | ICD-10-CM

## 2020-05-22 NOTE — Progress Notes (Signed)
Patient picked up f/o and was pleased with fit, comfort, and function.  Worked well with footwear.  Told of rbeak in period and how to report any issues.  

## 2020-07-03 ENCOUNTER — Other Ambulatory Visit: Payer: Self-pay

## 2020-07-03 ENCOUNTER — Ambulatory Visit (INDEPENDENT_AMBULATORY_CARE_PROVIDER_SITE_OTHER): Payer: BC Managed Care – PPO | Admitting: Podiatry

## 2020-07-03 DIAGNOSIS — E1065 Type 1 diabetes mellitus with hyperglycemia: Secondary | ICD-10-CM | POA: Diagnosis not present

## 2020-07-03 DIAGNOSIS — M2012 Hallux valgus (acquired), left foot: Secondary | ICD-10-CM

## 2020-07-03 DIAGNOSIS — M21612 Bunion of left foot: Secondary | ICD-10-CM | POA: Diagnosis not present

## 2020-07-03 NOTE — Progress Notes (Signed)
  Subjective:  Patient ID: [name] [name], male    DOB: September 17, 1960,  MRN: [id number]  Chief Complaint  Patient presents with  . Foot Pain    Pt stated that he is doing well he doesn't have the pain that he did have    60 y.o. male returns with the above complaint. History confirmed with patient.  He is doing quite well.  The bunion is not painful anymore.  His A1c is still elevated 8.3% but is improving.  He did get the diabetic shoes and inserts  Objective:  Physical Exam: warm, good capillary refill, no trophic changes or ulcerative lesions, normal DP and PT pulses and normal sensory exam. Left Foot: Moderate hallux valgus with a large dorsal medial eminence.  Tender to palpation.  Erythematous. Right Foot: No bony was noted   Radiographs: X-ray of both feet: no fracture, dislocation, swelling or degenerative changes noted, left foot he does have moderate hallux valgus with increased intermetatarsal angle and hallux abduction angle. Assessment:   1. Hallux valgus with bunions, left   2. Type 1 diabetes mellitus with hyperglycemia ([place])      Plan:  Patient was evaluated and treated and all questions answered.  He is doing quite well most of his pain has been alleviated.  I recommend he continue to be WBAT in the diabetic shoes and inserts as much as possible.  Continue to work on lowering his A1c.  Return as needed if his bunion becomes more painful and we can discuss further intervention including surgical correction  Return if symptoms worsen or fail to improve.

## 2020-07-21 ENCOUNTER — Telehealth: Payer: Self-pay | Admitting: Internal Medicine

## 2020-07-21 DIAGNOSIS — T466X5A Adverse effect of antihyperlipidemic and antiarteriosclerotic drugs, initial encounter: Secondary | ICD-10-CM

## 2020-07-21 DIAGNOSIS — E785 Hyperlipidemia, unspecified: Secondary | ICD-10-CM

## 2020-07-21 NOTE — Telephone Encounter (Signed)
Lipid & hepatic function panel ordered MyChart reminder message sent to patient Appointment to be scheduled

## 2020-08-06 ENCOUNTER — Other Ambulatory Visit: Payer: Self-pay | Admitting: Cardiology

## 2021-02-26 ENCOUNTER — Other Ambulatory Visit: Payer: Self-pay | Admitting: Cardiology

## 2021-04-26 ENCOUNTER — Other Ambulatory Visit: Payer: Self-pay | Admitting: Cardiology

## 2021-05-21 ENCOUNTER — Other Ambulatory Visit: Payer: Self-pay | Admitting: Cardiology

## 2021-05-22 ENCOUNTER — Other Ambulatory Visit: Payer: Self-pay | Admitting: Cardiology

## 2021-05-27 NOTE — Progress Notes (Signed)
Cardiology Office Note:    Date:  05/28/2021   ID:  [name] [name], DOB February 04, 1961, MRN [id number]  PCP:  Kristen Loader, FNP  Cardiologist:  Fransico Him, MD    Referring MD: Kristen Loader, FNP   Chief Complaint  Patient presents with   Coronary Artery Disease   Hypertension   Hyperlipidemia    History of Present Illness:    [name] [name] is a 61 y.o. male with a hx of nonobstructive ASCAD with 40-50% LAD, HTN, h/o pericarditis with pericardial effusion (resolved with NSAIDS) and dyslipidemia.  He is here today for followup and is doing well.  He denies any chest pain or pressure, SOB, DOE, PND, orthopnea, LE edema, dizziness or syncope. He occasionally will notice a skipped heart beat. He is compliant with his meds and is tolerating meds with no SE.     Past Medical History:  Diagnosis Date   Allergic reaction caused by a drug 03/07/2013   Asthma    uses asmanex and albuterol   Coronary artery disease 07/17/2004   nonobstructive ASCAD with 40-50% LAD   Dyslipidemia    Hyperlipidemia    Hypertension    Nephrolithiasis    Pericarditis 2016   resolved on followup echo   Type I diabetes mellitus ([place])    ; followed by Dr. Sherrie George    Past Surgical History:  Procedure Laterality Date   BACK SURGERY  2013   [place] AND ENDOSCOPY  03/2017   LITHOTRIPSY  YRS AGO X 1   ORCHIECTOMY Right 12/14/2013   Procedure: RIGHT RADICAL ORCHIECTOMY WITH PLACEMENT OF TESTICULAR PROTHESIS;  Surgeon: Ardis Hughs, MD;  Location: [facility];  Service: Urology;  Laterality: Right;   ROBOTIC ASSITED PARTIAL NEPHRECTOMY Left 10/28/2017   Procedure: XI ROBOTIC ASSITED PARTIAL LEFT NEPHRECTOMY;  Surgeon: Ardis Hughs, MD;  Location: [facility];  Service: Urology;  Laterality: Left;    Current Medications: Current Meds  Medication Sig   albuterol (PROVENTIL HFA;VENTOLIN HFA) 108 (90 Base) MCG/ACT inhaler  Inhale 2 puffs into the lungs every 4 (four) hours as needed.   amLODipine (NORVASC) 10 MG tablet TAKE 1 TABLET DAILY. PLEASE KEEP UPCOMING APPT IN FEBRUARY 2023 WITH DR. Radford Pax BEFORE ANYMORE FILLS   B-D INS SYR ULTRAFINE 1CC/30G 30G X 1/2" 1 ML MISC    BAQSIMI ONE PACK 3 MG/DOSE POWD AS DIRECTED FOR LOW BLOOD SUGAR REACTION NASALLY 90 DAYS   Continuous Blood Gluc Sensor (FREESTYLE LIBRE 14 DAY SENSOR) MISC . CHANGE SENSOR EVERY 14 DAYS . 28 DAYS   ezetimibe-simvastatin (VYTORIN) 10-20 MG tablet TAKE 1 TABLET BY MOUTH EVERY DAY   glucagon 1 MG injection Inject 1 mg into the vein once as needed (for low BS).    insulin aspart (NOVOLOG) 100 UNIT/ML injection Inject 4-12 Units into the skin 3 (three) times daily with meals. Per sliding scale 1 unit per 6 carbs   Insulin Glargine (BASAGLAR KWIKPEN) 100 UNIT/ML SOPN Inject 38 Units into the skin daily.   losartan (COZAAR) 50 MG tablet TAKE 1 TABLET BY MOUTH EVERY DAY   meloxicam (MOBIC) 15 MG tablet Take 15 mg by mouth daily as needed (muscle spasms).    nebivolol (BYSTOLIC) 2.5 MG tablet TAKE 1 TABLET BY MOUTH EVERY DAY   nitroGLYCERIN (NITROSTAT) 0.4 MG SL tablet Place 1 tablet (0.4 mg total) under the tongue every 5 (five) minutes as needed for chest  pain.   tamsulosin (FLOMAX) 0.4 MG CAPS capsule Take 0.4 mg by mouth daily as needed (prostate muscles).      Allergies:   Cardizem [diltiazem hcl], Carvedilol, Levofloxacin, and Lisinopril   Social History   Socioeconomic History   Marital status: Married    Spouse name: Not on file   Number of children: Not on file   Years of education: Not on file   Highest education level: Not on file  Occupational History   Occupation: Teacher  Tobacco Use   Smoking status: Never   Smokeless tobacco: Never  Vaping Use   Vaping Use: Never used  Substance and Sexual Activity   Alcohol use: Yes    Alcohol/week: 3.0 standard drinks    Types: 3 Cans of beer per week    Comment: 3 x weekly   Drug use:  No   Sexual activity: Not on file  Other Topics Concern   Not on file  Social History Narrative   Not on file   Social Determinants of Health   Financial Resource Strain: Not on file  Food Insecurity: Not on file  Transportation Needs: Not on file  Physical Activity: Not on file  Stress: Not on file  Social Connections: Not on file     Family History: The patient's family history includes Cancer in his maternal aunt and maternal uncle; Coronary artery disease in his father; Diabetes in his brother, father, and paternal grandmother; Hypertension in his father; Hypothyroidism in his sister; Lung cancer in his mother.  ROS:   Please see the history of present illness.    ROS  All other systems reviewed and negative.   EKGs/Labs/Other Studies Reviewed:    The following studies were reviewed today: none  EKG:  EKG is  ordered today.  The ekg ordered today demonstrates NSR with no St changes  Recent Labs: No results found for requested labs within last 8760 hours.   Recent Lipid Panel    Component Value Date/Time   CHOL 140 09/14/2019 0816   CHOL 126 02/01/2014 0823   TRIG 102 09/14/2019 0816   TRIG 55 02/01/2014 0823   HDL 48 09/14/2019 0816   HDL 68 02/01/2014 0823   CHOLHDL 2.9 09/14/2019 0816   CHOLHDL 2.0 09/18/2015 0814   VLDL 8 09/18/2015 0814   LDLCALC 73 09/14/2019 0816   LDLCALC 47 02/01/2014 0823    Physical Exam:    VS:  BP 130/62    Pulse 60    Ht 5\' 10"  (1.778 m)    Wt 155 lb (70.3 kg)    SpO2 97%    BMI 22.24 kg/m     Wt Readings from Last 3 Encounters:  05/28/21 155 lb (70.3 kg)  02/12/20 171 lb 3.2 oz (77.7 kg)  09/19/19 175 lb 3.2 oz (79.5 kg)    GEN: Well nourished, well developed in no acute distress HEENT: Normal NECK: No JVD; No carotid bruits LYMPHATICS: No lymphadenopathy CARDIAC:RRR, no murmurs, rubs, gallops RESPIRATORY:  Clear to auscultation without rales, wheezing or rhonchi  ABDOMEN: Soft, non-tender,  non-distended MUSCULOSKELETAL:  No edema; No deformity  SKIN: Warm and dry NEUROLOGIC:  Alert and oriented x 3 PSYCHIATRIC:  Normal affect    ASSESSMENT:    1. Coronary artery disease involving native coronary artery of native heart without angina pectoris   2. History of pericarditis   3. Primary hypertension   4. Dyslipidemia    PLAN:    In order of problems listed above:  1.  ASCAD -nonobstructive by cath -he has not had any anginal symptoms since I saw him last -continue prescription drug management with ASA 81mg  daily, Bystolic 2.5mg  daily and statin  2.  Pericardial effusion/pericarditis -this has resolved with no reoccurrence of CP -repeat echo showed no pericardial effusion  3.  Hypertension -BP is well controlled on exam today -continue prescription drug management with Bystolic 2.5mg  daily, Amlodipine 10mg  daily and Losartan 50mg  daily with PRN refills.   4.  Hyperlipidemia -LDL goal is < 70.  -check FLP and ALT -continue prescription drug management with Atorvastatin 20mg  daily with PRN refills  Medication Adjustments/Labs and Tests Ordered: Current medicines are reviewed at length with the patient today.  Concerns regarding medicines are outlined above.  Orders Placed This Encounter  Procedures   EKG 12-Lead   No orders of the defined types were placed in this encounter.   Signed, Fransico Him, MD  05/28/2021 8:28 AM    [place]

## 2021-05-28 ENCOUNTER — Other Ambulatory Visit: Payer: Self-pay

## 2021-05-28 ENCOUNTER — Ambulatory Visit: Payer: BC Managed Care – PPO | Admitting: Cardiology

## 2021-05-28 ENCOUNTER — Encounter: Payer: Self-pay | Admitting: Cardiology

## 2021-05-28 VITALS — BP 130/62 | HR 60 | Ht 70.0 in | Wt 155.0 lb

## 2021-05-28 DIAGNOSIS — Z8679 Personal history of other diseases of the circulatory system: Secondary | ICD-10-CM | POA: Diagnosis not present

## 2021-05-28 DIAGNOSIS — I10 Essential (primary) hypertension: Secondary | ICD-10-CM

## 2021-05-28 DIAGNOSIS — E785 Hyperlipidemia, unspecified: Secondary | ICD-10-CM

## 2021-05-28 DIAGNOSIS — I2510 Atherosclerotic heart disease of native coronary artery without angina pectoris: Secondary | ICD-10-CM | POA: Diagnosis not present

## 2021-05-28 LAB — LIPID PANEL
Chol/HDL Ratio: 3.2 ratio (ref 0.0–5.0)
Cholesterol, Total: 167 mg/dL (ref 100–199)
HDL: 52 mg/dL (ref >39)
LDL Chol Calc (NIH): 100 mg/dL — ABNORMAL HIGH (ref 0–99)
Triglycerides: 78 mg/dL (ref 0–149)
VLDL Cholesterol Cal: 15 mg/dL (ref 5–40)

## 2021-05-28 LAB — COMPREHENSIVE METABOLIC PANEL
ALT: 26 IU/L (ref 0–44)
AST: 22 IU/L (ref 0–40)
Albumin/Globulin Ratio: 2.6 — ABNORMAL HIGH (ref 1.2–2.2)
Albumin: 4.7 g/dL (ref 3.8–4.9)
Alkaline Phosphatase: 84 IU/L (ref 44–121)
BUN/Creatinine Ratio: 17 (ref 9–20)
BUN: 19 mg/dL (ref 6–24)
Bilirubin Total: 0.4 mg/dL (ref 0.0–1.2)
CO2: 24 mmol/L (ref 20–29)
Calcium: 9.6 mg/dL (ref 8.7–10.2)
Chloride: 99 mmol/L (ref 96–106)
Creatinine, Ser: 1.14 mg/dL (ref 0.76–1.27)
Globulin, Total: 1.8 g/dL (ref 1.5–4.5)
Glucose: 222 mg/dL — ABNORMAL HIGH (ref 70–99)
Potassium: 4.5 mmol/L (ref 3.5–5.2)
Sodium: 134 mmol/L (ref 134–144)
Total Protein: 6.5 g/dL (ref 6.0–8.5)
eGFR: 74 mL/min/{1.73_m2} (ref >59)

## 2021-05-28 MED ORDER — EZETIMIBE-SIMVASTATIN 10-20 MG PO TABS: 1.0000 | ORAL_TABLET | Freq: Every day | ORAL | 3 refills | Status: DC

## 2021-05-28 MED ORDER — NEBIVOLOL HCL 2.5 MG PO TABS: 2.5000 mg | ORAL_TABLET | Freq: Every day | ORAL | 3 refills | Status: DC

## 2021-05-28 MED ORDER — AMLODIPINE BESYLATE 10 MG PO TABS: 10.0000 mg | ORAL_TABLET | Freq: Every day | ORAL | 3 refills | Status: DC

## 2021-05-28 MED ORDER — LOSARTAN POTASSIUM 50 MG PO TABS: 50.0000 mg | ORAL_TABLET | Freq: Every day | ORAL | 3 refills | Status: DC

## 2021-05-28 NOTE — Patient Instructions (Signed)
Medication Instructions:  Your physician recommends that you continue on your current medications as directed. Please refer to the Current Medication list given to you today.  *If you need a refill on your cardiac medications before your next appointment, please call your pharmacy*   Lab Work: TODAY: CMET, FLP If you have labs (blood work) drawn today and your tests are completely normal, you will receive your results only by: [place] (if you have MyChart) OR A paper copy in the mail If you have any lab test that is abnormal or we need to change your treatment, we will call you to review the results.  Follow-Up: At [facility], you and your health needs are our priority.  As part of our continuing mission to provide you with exceptional heart care, we have created designated Provider Care Teams.  These Care Teams include your primary Cardiologist (physician) and Advanced Practice Providers (APPs -  Physician Assistants and Nurse Practitioners) who all work together to provide you with the care you need, when you need it.  Your next appointment:   1 year(s)  The format for your next appointment:   In Person  Provider:   Fransico Him, MD

## 2021-05-28 NOTE — Addendum Note (Signed)
Addended by: Molli Barrows on: 05/28/2021 08:40 AM   Modules accepted: Orders

## 2021-05-29 ENCOUNTER — Telehealth: Payer: Self-pay

## 2021-05-29 DIAGNOSIS — E785 Hyperlipidemia, unspecified: Secondary | ICD-10-CM

## 2021-05-29 NOTE — Telephone Encounter (Signed)
-----   Message from Sueanne Margarita, MD sent at 05/29/2021 10:24 AM EST ----- LDL is not at goal of less than 70.  Please refer to lipid clinic

## 2021-05-29 NOTE — Telephone Encounter (Signed)
Left detailed message per DPR and referral placed to lipid clinic

## 2021-07-10 ENCOUNTER — Encounter: Payer: Self-pay | Admitting: Cardiology

## 2021-07-31 NOTE — Progress Notes (Signed)
Patient ID: [name]                 DOB: Sep 13, 1960                    MRN: [id number] ? ? ? ? ?HPI: ?[name] [name] is a 61 y.o. male patient referred to lipid clinic by Dr. Radford Pax. 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  ? ?Seen by Dr. Debara Pickett in December 2020 where LDL of 83 was uncontrolled on atorvastatin 20 mg. History of myalgias with rosuvastatin after which patient he switched to atorvastatin in 2017. Patient was previously taking atorvastatin 40 mg and self-titrated down to 20 mg as he felt it was better tolerated. He was advised to increase to atorvastatin 80 mg but he declined due to concerns of myopathy. Patient was started on combination ezetimibe-simvastatin which brought LDL to 69 mg/dL. At visit with his endocrinologist, was found to have slightly elevated LFTs (AST 50, ALT 60) which patient was quite concerned about. Took a 3 month statin holiday. He was restarted on Vytorin and underwent RUQ ultrasound that was negative for hepatic steatosis. At most recent visit 05/28/21 with Dr. Radford Pax, patient was compliant with all meds and tolerating well. Repeat lipid panel with LDL of 100 mg/dL above goal and referral placed for PharmD clinic. ? ?Patient presents to PharmD lipid clinic today in good spirits. Denies myalgias, joint pain.  Reports no major changes in diet. Patient works as a Pharmacist, hospital and says he hasn't exercised as much as the school year is ending. Anticipates that he will be more active in the summer doing yard work, biking, and playing basketball. He walks for 15 minutes at a leisure pace most days of the week. Doesn't eat fast food, says he watches his diet closely as a diabetic.   ? ?Current Medications: Ezetimibe-simvastatin 10-20 mg daily ?Intolerances: Atorvastatin 20-40 mg - myalgias; rosuvastatin - myalgias ?Risk Factors: HTN, CAD ?LDL goal: <70 mg/dL ? ?Diet: Breakfast - bowl of  cereal, package of greek yogurt. Lunch - packs lunch - applecauce, granola bar, PB sandwich on occasion. Dinners - home cooked and varies  ?Dessert - daily  ? ?Exercise: walks, will do more aerobic exercise over the summer when school is out (yard work, biking, basketball) ? ?Family History: Cancer in his maternal aunt and maternal uncle; Coronary artery disease in his father; Diabetes in his brother, father, and paternal grandmother; Hypertension in his father; Hypothyroidism in his sister; Lung cancer in his mother. ? ?Social History: No reported tobacco or illicit drug use.  ? ?Labs: ?05/28/2021: TC 167, TG 78, HDL 52, LDL 100 ?10/01/2019: TC 140, TG 102, HDL 48, LDL 73 ?06/06/2019: TC 136, TG 101, HDL 48, LDL 69 ? ?Past Medical History:  ?Diagnosis Date  ? Allergic reaction caused by a drug 03/07/2013  ? Asthma   ? uses asmanex and albuterol  ? Coronary artery disease 07/17/2004  ? nonobstructive ASCAD with 40-50% LAD  ? Dyslipidemia   ? Hyperlipidemia   ? Hypertension   ? Nephrolithiasis   ? Pericarditis 2016  ? resolved on followup echo  ? Type I diabetes mellitus (HCC)   ? ; followed by Dr. Louanna Raw Tannenmbaum  ? ? ?Current Outpatient Medications on File Prior to Visit  ?Medication Sig Dispense Refill  ? albuterol (PROVENTIL HFA;VENTOLIN HFA) 108 (90 Base) MCG/ACT inhaler Inhale 2 puffs into the lungs every  4 (four) hours as needed. 1 Inhaler 6  ? amLODipine (NORVASC) 10 MG tablet Take 1 tablet (10 mg total) by mouth daily. 90 tablet 3  ? aspirin 81 MG chewable tablet Chew 81 mg by mouth daily.    ? B-D INS SYR ULTRAFINE 1CC/30G 30G X 1/2" 1 ML MISC     ? BAQSIMI ONE PACK 3 MG/DOSE POWD AS DIRECTED FOR LOW BLOOD SUGAR REACTION NASALLY 90 DAYS    ? Continuous Blood Gluc Sensor (FREESTYLE LIBRE 14 DAY SENSOR) MISC . CHANGE SENSOR EVERY 14 DAYS . 28 DAYS    ? ezetimibe-simvastatin (VYTORIN) 10-20 MG tablet Take 1 tablet by mouth daily. 90 tablet 3  ? glucagon 1 MG injection Inject 1 mg into the vein once as  needed (for low BS).     ? insulin aspart (NOVOLOG) 100 UNIT/ML injection Inject 4-12 Units into the skin 3 (three) times daily with meals. Per sliding scale 1 unit per 6 carbs    ? Insulin Glargine (BASAGLAR KWIKPEN) 100 UNIT/ML SOPN Inject 38 Units into the skin daily.    ? losartan (COZAAR) 50 MG tablet Take 1 tablet (50 mg total) by mouth daily. 90 tablet 3  ? meloxicam (MOBIC) 15 MG tablet Take 15 mg by mouth daily as needed (muscle spasms).     ? nebivolol (BYSTOLIC) 2.5 MG tablet Take 1 tablet (2.5 mg total) by mouth daily. 90 tablet 3  ? nitroGLYCERIN (NITROSTAT) 0.4 MG SL tablet Place 1 tablet (0.4 mg total) under the tongue every 5 (five) minutes as needed for chest pain. 25 tablet 1  ? tamsulosin (FLOMAX) 0.4 MG CAPS capsule Take 0.4 mg by mouth daily as needed (prostate muscles).     ? ?No current facility-administered medications on file prior to visit.  ? ? ?Allergies  ?Allergen Reactions  ? Cardizem [Diltiazem Hcl] Other (See Comments)  ?  Flushing and chest pressure  ? Carvedilol Hives and Itching  ? Levofloxacin Other (See Comments)  ?  stomach upset  ? Lisinopril Other (See Comments)  ? ? ?Assessment/Plan: ? ?1. Hyperlipidemia - LDL 100 is above goal of < 70 mg/dL on ezetimibe-simvastatin 10-20 mg daily. Reports taking a 3 month statin holiday this past fall with borderline elevation of LFTs. History of myalgias with atorvastatin and rosuvastatin. Emphasized importance of exercise and encouraged to increase intensity of exercise to brisk pace and incorporate resistance training. Discussed options of Nexlitol or PCSK9 inhibitor, Repatha and LDL lowering of 15% and up to 60% respectively with CV benefit. Patient preferred twice weekly injections with a greater LDL reduction instead of adding a daily tablet. Administration technique was reviewed, patient is comfortable with injections with his history of insulin use. Will submit a prior authorization with his insurance. Told patient that we will send  a prescription to his preferred pharmacy and reach out when it's approved to reinforce administration technique, answer questions and schedule repeat lipid panel in 2 to 3 months. ? ?Thank you, ? ?Laurey Arrow, PharmD ?PGY1 Pharmacy Resident ? ?Ramond Dial, Pharm.D, BCPS, CPP ?[address], [place], [place] [address]  ?Phone: [phone]; Fax: [phone]  ? ?

## 2021-08-04 ENCOUNTER — Ambulatory Visit: Payer: BC Managed Care – PPO | Admitting: Pharmacist

## 2021-08-04 VITALS — Wt 169.6 lb

## 2021-08-04 DIAGNOSIS — E785 Hyperlipidemia, unspecified: Secondary | ICD-10-CM

## 2021-08-04 NOTE — Patient Instructions (Addendum)
We will submit a prior authorization for the PCSK9 inhibitor, Repatha, to get an additional 50% lowering of LDL to target your goal of < 70 mg/dL. ? ?Pending approval, we will start Repatha injections into the side of your thigh or your abdomen (2 inches away from belly button) every two weeks.  ? ?We will call you to schedule repeat labs.  ?

## 2021-08-06 ENCOUNTER — Telehealth: Payer: Self-pay | Admitting: Pharmacist

## 2021-08-06 DIAGNOSIS — E785 Hyperlipidemia, unspecified: Secondary | ICD-10-CM

## 2021-08-06 MED ORDER — PRALUENT 75 MG/ML ~~LOC~~ SOAJ: 1.0000 "pen " | SUBCUTANEOUS | 11 refills | Status: DC

## 2021-08-06 NOTE — Telephone Encounter (Signed)
I called the pharmacy and copay card was given to them as requested. They stated that it lowereed the cost to $35 ?

## 2021-08-06 NOTE — Telephone Encounter (Signed)
Praluent approved through 08/05/22.  ? ?BIN# [id number] ?PCN# [id number] ?GRP# [id number] ?ID [id number] ? ?Patient made aware. Labs scheduled for 6/30. Patient aware to continue vytroin and add praluent. ?

## 2021-10-09 ENCOUNTER — Other Ambulatory Visit: Payer: BC Managed Care – PPO

## 2021-10-09 DIAGNOSIS — E785 Hyperlipidemia, unspecified: Secondary | ICD-10-CM

## 2021-10-10 LAB — LIPID PANEL
Chol/HDL Ratio: 1.5 ratio (ref 0.0–5.0)
Cholesterol, Total: 86 mg/dL — ABNORMAL LOW (ref 100–199)
HDL: 59 mg/dL (ref >39)
LDL Chol Calc (NIH): 14 mg/dL (ref 0–99)
Triglycerides: 52 mg/dL (ref 0–149)
VLDL Cholesterol Cal: 13 mg/dL (ref 5–40)

## 2021-10-10 LAB — HEPATIC FUNCTION PANEL
ALT: 26 IU/L (ref 0–44)
AST: 23 IU/L (ref 0–40)
Albumin: 4.6 g/dL (ref 3.8–4.9)
Alkaline Phosphatase: 79 IU/L (ref 44–121)
Bilirubin Total: 0.4 mg/dL (ref 0.0–1.2)
Bilirubin, Direct: 0.14 mg/dL (ref 0.00–0.40)
Total Protein: 6.5 g/dL (ref 6.0–8.5)

## 2021-10-10 LAB — APOLIPOPROTEIN B: Apolipoprotein B: 27 mg/dL (ref <90)

## 2021-10-12 ENCOUNTER — Encounter: Payer: Self-pay | Admitting: Cardiology

## 2021-10-30 ENCOUNTER — Telehealth: Payer: Self-pay | Admitting: Pharmacist

## 2021-10-30 NOTE — Telephone Encounter (Signed)
Received fax from pharmacy that Praluent is no longer covered on pt's insurance and now Repatha is. PA has been submitted.

## 2021-11-03 ENCOUNTER — Encounter: Payer: Self-pay | Admitting: Pharmacist

## 2021-11-03 MED ORDER — REPATHA SURECLICK 140 MG/ML ~~LOC~~ SOAJ: 1.0000 | SUBCUTANEOUS | 3 refills | Status: DC

## 2021-11-03 NOTE — Telephone Encounter (Signed)
Spoke with pt, he is aware of med change.

## 2021-11-03 NOTE — Telephone Encounter (Signed)
Received message that Repatha is available without prior authorization. Copay card activated: BIN: [id number] PCN: CN GRP: [id number] ID: [id number]  Left message for pt. Will need to make him aware of rx change and send in new med, then send mychart message with copay card info.

## 2022-05-22 ENCOUNTER — Other Ambulatory Visit: Payer: Self-pay | Admitting: Cardiology

## 2022-06-01 ENCOUNTER — Other Ambulatory Visit: Payer: Self-pay | Admitting: Cardiology

## 2022-06-23 ENCOUNTER — Telehealth: Payer: Self-pay | Admitting: Cardiology

## 2022-06-23 NOTE — Telephone Encounter (Signed)
Called patient 2x to r/s appt on 03/28, LVM both times. Sending MyChart message

## 2022-07-08 ENCOUNTER — Ambulatory Visit: Payer: BC Managed Care – PPO | Admitting: Cardiology

## 2022-07-21 ENCOUNTER — Other Ambulatory Visit: Payer: Self-pay | Admitting: Cardiology

## 2022-08-30 ENCOUNTER — Other Ambulatory Visit: Payer: Self-pay | Admitting: Cardiology

## 2022-10-11 ENCOUNTER — Other Ambulatory Visit: Payer: Self-pay | Admitting: Cardiology

## 2022-10-25 ENCOUNTER — Telehealth: Payer: Self-pay

## 2022-10-25 ENCOUNTER — Other Ambulatory Visit (HOSPITAL_COMMUNITY): Payer: Self-pay

## 2022-10-25 NOTE — Telephone Encounter (Signed)
Pharmacy Patient Advocate Encounter   Received notification from CoverMyMeds that prior authorization for REPATHA is required/requested.   Insurance verification completed.   The patient is insured through [facility] .   PA submitted to [facility] via CoverMyMeds Key/confirmation #/EOC [facility] Status is pending

## 2022-10-25 NOTE — Telephone Encounter (Signed)
Pharmacy Patient Advocate Encounter  Received notification from [facility] that Prior Authorization for REPATHA has been APPROVED from 10/25/22 to 10/25/23.Marland Kitchen

## 2022-11-25 ENCOUNTER — Other Ambulatory Visit: Payer: Self-pay | Admitting: Cardiology

## 2022-11-29 NOTE — Progress Notes (Unsigned)
Cardiology Office Note:    Date:  11/30/2022   ID:  [name] [name], DOB 07-18-1960, MRN [id number]  PCP:  Soundra Pilon, FNP  Cardiologist:  Armanda Magic, MD    Referring MD: Soundra Pilon, FNP   Chief Complaint  Patient presents with   Coronary Artery Disease   Hypertension   Hyperlipidemia    History of Present Illness:    [name] [name] is a 62 y.o. male with a hx of nonobstructive ASCAD with 40-50% LAD, HTN, h/o pericarditis with pericardial effusion (resolved with NSAIDS) and dyslipidemia.  He is here today for followup and is doing well.  He denies any chest pain or pressure, SOB, DOE, PND, orthopnea, or syncope.  He has had issues with taking Sildenafil in the am along with his BP meds.   Occasionally he will have some LE edema. He has had palpitations unless he is using his Alfuzosin and he stopped it. He is compliant with his meds and is tolerating meds with no SE.     Past Medical History:  Diagnosis Date   Allergic reaction caused by a drug 03/07/2013   Asthma    uses asmanex and albuterol   Coronary artery disease 07/17/2004   nonobstructive ASCAD with 40-50% LAD   Dyslipidemia    Hyperlipidemia    Hypertension    Nephrolithiasis    Pericarditis 2016   resolved on followup echo   Type I diabetes mellitus (HCC)    ; followed by Dr. Zigmund Daniel    Past Surgical History:  Procedure Laterality Date   BACK SURGERY  2013   LOWER    CARDIAC CATHETERIZATION     COLONSCOPY AND ENDOSCOPY  03/2017   LITHOTRIPSY  YRS AGO X 1   ORCHIECTOMY Right 12/14/2013   Procedure: RIGHT RADICAL ORCHIECTOMY WITH PLACEMENT OF TESTICULAR PROTHESIS;  Surgeon: Crist Fat, MD;  Location: [facility];  Service: Urology;  Laterality: Right;   ROBOTIC ASSITED PARTIAL NEPHRECTOMY Left 10/28/2017   Procedure: XI ROBOTIC ASSITED PARTIAL LEFT NEPHRECTOMY;  Surgeon: Crist Fat, MD;  Location: [facility];  Service: Urology;  Laterality: Left;     Current Medications: Current Meds  Medication Sig   albuterol (PROVENTIL HFA;VENTOLIN HFA) 108 (90 Base) MCG/ACT inhaler Inhale 2 puffs into the lungs every 4 (four) hours as needed.   alfuzosin (UROXATRAL) 10 MG 24 hr tablet Take 10 mg by mouth daily.   amLODipine (NORVASC) 10 MG tablet TAKE 1 TABLET BY MOUTH DAILY. PLEASE KEEP SCHEDULED APPOINTMENT FOR FUTURE REFILLS. THANK YOU.   aspirin 81 MG chewable tablet Chew 81 mg by mouth daily.   B-D INS SYR ULTRAFINE 1CC/30G 30G X 1/2" 1 ML MISC    BAQSIMI ONE PACK 3 MG/DOSE POWD AS DIRECTED FOR LOW BLOOD SUGAR REACTION NASALLY 90 DAYS   Continuous Blood Gluc Sensor (FREESTYLE LIBRE 14 DAY SENSOR) MISC . CHANGE SENSOR EVERY 14 DAYS . 28 DAYS   Evolocumab (REPATHA SURECLICK) 140 MG/ML SOAJ INJECT 1 PEN INTO THE SKIN EVERY 14 (FOURTEEN) DAYS.   ezetimibe-simvastatin (VYTORIN) 10-20 MG tablet Take 1 tablet by mouth daily.   glucagon 1 MG injection Inject 1 mg into the vein once as needed (for low BS).    insulin aspart (NOVOLOG) 100 UNIT/ML injection Inject 4-12 Units into the skin 3 (three) times daily with meals. Per sliding scale 1 unit per 6 carbs   Insulin Glargine (BASAGLAR KWIKPEN) 100 UNIT/ML SOPN Inject 38 Units into the skin daily.  losartan (COZAAR) 50 MG tablet TAKE 1 TABLET BY MOUTH EVERY DAY   meloxicam (MOBIC) 15 MG tablet Take 15 mg by mouth daily as needed (muscle spasms).    nebivolol (BYSTOLIC) 2.5 MG tablet TAKE 1 TABLET BY MOUTH DAILY. PLEASE KEEP SCHEDULED APPOINTMENT FOR FUTURE REFILLS. THANK YOU.   nitroGLYCERIN (NITROSTAT) 0.4 MG SL tablet Place 1 tablet (0.4 mg total) under the tongue every 5 (five) minutes as needed for chest pain.   tamsulosin (FLOMAX) 0.4 MG CAPS capsule Take 0.4 mg by mouth daily as needed (prostate muscles).      Allergies:   Cardizem [diltiazem hcl], Carvedilol, Levofloxacin, and Lisinopril   Social History   Socioeconomic History   Marital status: Married    Spouse name: Not on file    Number of children: Not on file   Years of education: Not on file   Highest education level: Not on file  Occupational History   Occupation: Teacher  Tobacco Use   Smoking status: Never   Smokeless tobacco: Never  Vaping Use   Vaping status: Never Used  Substance and Sexual Activity   Alcohol use: Yes    Alcohol/week: 3.0 standard drinks of alcohol    Types: 3 Cans of beer per week    Comment: 3 x weekly   Drug use: No   Sexual activity: Not on file  Other Topics Concern   Not on file  Social History Narrative   Not on file   Social Determinants of Health   Financial Resource Strain: Not on file  Food Insecurity: Not on file  Transportation Needs: Not on file  Physical Activity: Not on file  Stress: Not on file  Social Connections: Not on file     Family History: The patient's family history includes Cancer in his maternal aunt and maternal uncle; Coronary artery disease in his father; Diabetes in his brother, father, and paternal grandmother; Hypertension in his father; Hypothyroidism in his sister; Lung cancer in his mother.  ROS:   Please see the history of present illness.    ROS  All other systems reviewed and negative.   EKGs/Labs/Other Studies Reviewed:    The following studies were reviewed today:  EKG Interpretation Date/Time:  Tuesday November 30 2022 08:43:52 EDT Ventricular Rate:  64 PR Interval:  144 QRS Duration:  96 QT Interval:  398 QTC Calculation: 410 R Axis:   47  Text Interpretation: Normal sinus rhythm Normal ECG When compared with ECG of 21-Oct-2017 10:16, No significant change was found Confirmed by Armanda Magic ([id number]) on 11/30/2022 8:49:18 AM     Recent Labs: No results found for requested labs within last 365 days.   Recent Lipid Panel    Component Value Date/Time   CHOL 86 (L) 10/09/2021 0813   CHOL 126 02/01/2014 0823   TRIG 52 10/09/2021 0813   TRIG 55 02/01/2014 0823   HDL 59 10/09/2021 0813   HDL 68 02/01/2014 0823    CHOLHDL 1.5 10/09/2021 0813   CHOLHDL 2.0 09/18/2015 0814   VLDL 8 09/18/2015 0814   LDLCALC 14 10/09/2021 0813   LDLCALC 47 02/01/2014 0823    Physical Exam:    VS:  BP 128/82 (BP Location: Left Arm, Patient Position: Sitting, Cuff Size: Normal)   Pulse 65   Ht 5\' 10"  (1.778 m)   Wt 176 lb (79.8 kg)   SpO2 96%   BMI 25.25 kg/m     Wt Readings from Last 3 Encounters:  11/30/22 176 lb (79.8  kg)  08/04/21 169 lb 9.6 oz (76.9 kg)  05/28/21 155 lb (70.3 kg)    GEN: Well nourished, well developed in no acute distress HEENT: Normal NECK: No JVD; No carotid bruits LYMPHATICS: No lymphadenopathy CARDIAC:RRR, no murmurs, rubs, gallops RESPIRATORY:  Clear to auscultation without rales, wheezing or rhonchi  ABDOMEN: Soft, non-tender, non-distended MUSCULOSKELETAL:  No edema; No deformity  SKIN: Warm and dry NEUROLOGIC:  Alert and oriented x 3 PSYCHIATRIC:  Normal affect  ASSESSMENT:    1. Coronary artery disease involving native coronary artery of native heart without angina pectoris   2. History of pericarditis   3. Primary hypertension   4. Dyslipidemia     PLAN:    In order of problems listed above:  1.  ASCAD -nonobstructive by cath -He denies any anginal symptoms since his last office visit -Continue prescription drug managed with amlodipine 10 mg daily, aspirin 81 mg daily, Vytorin 10-20 mg daily, Repatha, Bystolic 2.5 mg daily with as needed refills   2.  Pericardial effusion/pericarditis -this has resolved with no reoccurrence of CP -repeat echo showed no pericardial effusion  3.  Hypertension -BP is adequately controlled on exam today -Continue drug management with Bystolic 2.5 mg daily, amlodipine 10 mg daily and losartan 50 mg daily with as needed refills -Since he has had problems with dizziness on days he takes SIldenafil in the am, recommend that he change his Losartan to bedtime instead of in the am with his amlodipine and Bysotlic -also told him to stop  the Alfuzosin and let his Urologist know that it causes palpitations likely related to a drop in his BP with rebound tachy  4.  Hyperlipidemia -LDL goal is < 70.  -I have personally reviewed and interpreted outside labs performed by patient's PCP which showed LDL 21 and HDL 50 on 10/07/2022 -Continue drug management with Repatha and Vytorin 10-20 mg daily with as needed refills  Followup with me in 1 year  Medication Adjustments/Labs and Tests Ordered: Current medicines are reviewed at length with the patient today.  Concerns regarding medicines are outlined above.  Orders Placed This Encounter  Procedures   EKG 12-Lead   No orders of the defined types were placed in this encounter.   Signed, Armanda Magic, MD  11/30/2022 8:31 AM    [facility]

## 2022-11-30 ENCOUNTER — Encounter: Payer: Self-pay | Admitting: Cardiology

## 2022-11-30 ENCOUNTER — Ambulatory Visit: Payer: BC Managed Care – PPO | Attending: Cardiology | Admitting: Cardiology

## 2022-11-30 VITALS — BP 128/82 | HR 65 | Ht 70.0 in | Wt 176.0 lb

## 2022-11-30 DIAGNOSIS — Z8679 Personal history of other diseases of the circulatory system: Secondary | ICD-10-CM | POA: Diagnosis not present

## 2022-11-30 DIAGNOSIS — I2510 Atherosclerotic heart disease of native coronary artery without angina pectoris: Secondary | ICD-10-CM | POA: Diagnosis not present

## 2022-11-30 DIAGNOSIS — I10 Essential (primary) hypertension: Secondary | ICD-10-CM | POA: Diagnosis not present

## 2022-11-30 DIAGNOSIS — E785 Hyperlipidemia, unspecified: Secondary | ICD-10-CM

## 2022-11-30 MED ORDER — LOSARTAN POTASSIUM 50 MG PO TABS: 50.0000 mg | ORAL_TABLET | Freq: Every evening | ORAL | 0 refills | Status: DC

## 2022-11-30 NOTE — Patient Instructions (Signed)
Medication Instructions:  Please start taking your Losartan 50 mg every evening instead of every morning.  *If you need a refill on your cardiac medications before your next appointment, please call your pharmacy*   Lab Work: None.  If you have labs (blood work) drawn today and your tests are completely normal, you will receive your results only by: MyChart Message (if you have MyChart) OR A paper copy in the mail If you have any lab test that is abnormal or we need to change your treatment, we will call you to review the results.   Testing/Procedures: None.   Follow-Up: At [facility], you and your health needs are our priority.  As part of our continuing mission to provide you with exceptional heart care, we have created designated Provider Care Teams.  These Care Teams include your primary Cardiologist (physician) and Advanced Practice Providers (APPs -  Physician Assistants and Nurse Practitioners) who all work together to provide you with the care you need, when you need it.  We recommend signing up for the patient portal called "MyChart".  Sign up information is provided on this After Visit Summary.  MyChart is used to connect with patients for Virtual Visits (Telemedicine).  Patients are able to view lab/test results, encounter notes, upcoming appointments, etc.  Non-urgent messages can be sent to your provider as well.   To learn more about what you can do with MyChart, go to [email or web address].    Your next appointment:   1 year(s)  Provider:   Armanda Magic, MD

## 2022-12-22 ENCOUNTER — Other Ambulatory Visit: Payer: Self-pay | Admitting: Cardiology

## 2022-12-26 ENCOUNTER — Other Ambulatory Visit: Payer: Self-pay | Admitting: Cardiology

## 2023-01-14 ENCOUNTER — Other Ambulatory Visit: Payer: Self-pay | Admitting: Cardiology

## 2023-12-12 ENCOUNTER — Other Ambulatory Visit: Payer: Self-pay | Admitting: Cardiology

## 2023-12-17 ENCOUNTER — Other Ambulatory Visit: Payer: Self-pay | Admitting: Cardiology

## 2024-01-23 NOTE — Progress Notes (Signed)
 [name] [name]

## 2024-01-24 ENCOUNTER — Encounter: Payer: Self-pay | Admitting: Cardiology

## 2024-01-24 ENCOUNTER — Ambulatory Visit: Payer: Self-pay | Attending: Cardiology | Admitting: Cardiology

## 2024-01-24 VITALS — BP 130/72 | HR 62 | Ht 70.0 in | Wt 173.4 lb

## 2024-01-24 DIAGNOSIS — Z8679 Personal history of other diseases of the circulatory system: Secondary | ICD-10-CM

## 2024-01-24 DIAGNOSIS — I10 Essential (primary) hypertension: Secondary | ICD-10-CM | POA: Diagnosis not present

## 2024-01-24 DIAGNOSIS — I2510 Atherosclerotic heart disease of native coronary artery without angina pectoris: Secondary | ICD-10-CM | POA: Diagnosis not present

## 2024-01-24 DIAGNOSIS — E785 Hyperlipidemia, unspecified: Secondary | ICD-10-CM | POA: Diagnosis not present

## 2024-01-24 NOTE — Addendum Note (Signed)
 Addended by: VICCI ROXIE CROME on: 01/24/2024 09:55 AM   Modules accepted: Orders

## 2024-01-24 NOTE — Patient Instructions (Addendum)
 Medication Instructions:  Your physician has recommended you make the following change in your medication:   1) STOP ezetimibe -simvastatin  (Vytorin )  *If you need a refill on your cardiac medications before your next appointment, please call your pharmacy*  Lab Work: In 8 weeks (around 03/20/24): fasting lipid panel, ALT If you have labs (blood work) drawn today and your tests are completely normal, you will receive your results only by: MyChart Message (if you have MyChart) OR A paper copy in the mail If you have any lab test that is abnormal or we need to change your treatment, we will call you to review the results.  Follow-Up: At [facility], you and your health needs are our priority.  As part of our continuing mission to provide you with exceptional heart care, our providers are all part of one team.  This team includes your primary Cardiologist (physician) and Advanced Practice Providers or APPs (Physician Assistants and Nurse Practitioners) who all work together to provide you with the care you need, when you need it.  Your next appointment:   1 year(s)  Provider:   Wilbert Bihari, MD   We recommend signing up for the patient portal called MyChart.  Sign up information is provided on this After Visit Summary.  MyChart is used to connect with patients for Virtual Visits (Telemedicine).  Patients are able to view lab/test results, encounter notes, upcoming appointments, etc.  Non-urgent messages can be sent to your provider as well.    To learn more about what you can do with MyChart, go to [email or web address].

## 2024-02-04 ENCOUNTER — Other Ambulatory Visit: Payer: Self-pay | Admitting: Cardiology

## 2024-02-07 ENCOUNTER — Telehealth: Payer: Self-pay | Admitting: Pharmacy Technician

## 2024-02-07 NOTE — Telephone Encounter (Signed)
   Pharmacy Patient Advocate Encounter   Received notification from CoverMyMeds that prior authorization for repatha  is required/requested.   Insurance verification completed.   The patient is insured through [facility].   Per test claim: PA required; PA submitted to above mentioned insurance via Latent Key/confirmation #/EOC [id number] Status is pending

## 2024-02-07 NOTE — Telephone Encounter (Signed)
 Pharmacy Patient Advocate Encounter  Received notification from [facility] that Prior Authorization for repatha  has been APPROVED from 02/07/24 to 02/06/25   PA #/Case ID/Reference #: [id number]

## 2024-03-11 ENCOUNTER — Other Ambulatory Visit: Payer: Self-pay | Admitting: Cardiology

## 2024-03-26 ENCOUNTER — Encounter: Admitting: Dietician

## 2024-03-30 LAB — LIPID PANEL
Chol/HDL Ratio: 2.9 ratio (ref 0.0–5.0)
Cholesterol, Total: 146 mg/dL (ref 100–199)
HDL: 51 mg/dL
LDL Chol Calc (NIH): 73 mg/dL (ref 0–99)
Triglycerides: 122 mg/dL (ref 0–149)
VLDL Cholesterol Cal: 22 mg/dL (ref 5–40)

## 2024-03-30 LAB — ALT: ALT: 25 IU/L (ref 0–44)

## 2024-04-02 ENCOUNTER — Ambulatory Visit: Payer: Self-pay | Admitting: Cardiology
